# Patient Record
Sex: Female | Born: 1955
Health system: Southern US, Community
[De-identification: ages and names within clinical notes are randomized; demographics above are authoritative.]

## PROBLEM LIST (undated history)

## (undated) DIAGNOSIS — Z8739 Personal history of other diseases of the musculoskeletal system and connective tissue: Secondary | ICD-10-CM

## (undated) HISTORY — DX: Personal history of other diseases of the musculoskeletal system and connective tissue: Z87.39

## (undated) HISTORY — PX: OTHER SURGICAL HISTORY: SHX169

---

## 2016-02-05 HISTORY — PX: BLADDER TUMOR EXCISION: SHX238

## 2017-02-17 DIAGNOSIS — R87613 High grade squamous intraepithelial lesion on cytologic smear of cervix (HGSIL): Secondary | ICD-10-CM

## 2017-11-17 ENCOUNTER — Ambulatory Visit: Admit: 2017-11-17 | Discharge: 2017-11-17 | Payer: BLUE CROSS/BLUE SHIELD

## 2017-11-17 DIAGNOSIS — Z0000 Encounter for general adult medical examination without abnormal findings: Secondary | ICD-10-CM

## 2017-11-17 DIAGNOSIS — Z5189 Encounter for other specified aftercare: Secondary | ICD-10-CM

## 2017-11-17 DIAGNOSIS — F419 Anxiety disorder, unspecified: Secondary | ICD-10-CM

## 2017-11-17 DIAGNOSIS — L989 Disorder of the skin and subcutaneous tissue, unspecified: Secondary | ICD-10-CM

## 2017-11-17 DIAGNOSIS — M1711 Unilateral primary osteoarthritis, right knee: Secondary | ICD-10-CM

## 2017-11-17 DIAGNOSIS — Z96651 Presence of right artificial knee joint: Secondary | ICD-10-CM

## 2017-11-17 DIAGNOSIS — Z1331 Encounter for screening for depression: Secondary | ICD-10-CM

## 2017-11-17 MED ORDER — LORAZEPAM 1 MG TABLET
1 | ORAL_TABLET | Freq: Every day | ORAL | 0 refills | Status: DC | PRN
Start: 2017-11-17 — End: 2018-05-04

## 2017-11-17 MED ORDER — TRAZODONE 50 MG TABLET
50 | ORAL | Status: DC
Start: 2017-11-17 — End: 2018-05-04

## 2017-11-17 MED ORDER — LORAZEPAM 1 MG TABLET
1 | ORAL | Status: DC
Start: 2017-11-17 — End: 2017-11-17

## 2017-11-17 NOTE — Patient Instructions (Signed)
(  1) Let me know about what you find regarding breast cancer screening modality    (2) Referral to Dermatology    (3) Referral to [facility] for Integrative Medicine -- [email or web address]    (4) Labs    ------    It was very nice to meet you. We have same day appointments for urgent illnesses. Please call during the morning if possible, and we will try to offer you an appointment that day or the next day.     We also have Telehealth (phone/video) visits for problems that don't need a physical exam. Please ask our staff about this if you are interested.    For test results, I will usually notify you within 1-5 days of their completion.     My clinic sessions are Tuesdays, Thursdays, and Fridays all day, Monday and Wednesday mornings. When I am not in the office, urgent matters will be handled by our nurses and my colleagues.     Please send a brief message via MyChart or call the office if you have questions or concerns. Please allow 1-4 days for a response. Thank you!   Celine Mans, MD

## 2017-11-17 NOTE — Progress Notes (Signed)
[facility]    Gina Spence is a 62 y.o. female here for:    Chief Complaint   Patient presents with    Establish Care       HPI  New patient to me. 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   Declined LEEP  Per [facility] --   "Trying naturopathic treatments - topical and systemic Garlic and CBD oil  No obvious findings on colpo or cx bx 04/14/17 but HSIL on ECC   Repeat Pap and ECC 07/07/17: ASCUS with HPV 16 and ECC - negative  Advise repeat pap/ECC in 6 months"  She has follow-up coming up in December 2019  "I eradicated it myself with essential oils, mushrooms, cannabis, garlic oils"    She is a psychotherapist -- specializing in trauma, including cancer patients    #Interest in naturopathy, Functional Medicine  #Seen at [facility] 05/2015  She works with a Loss adjuster, chartered in [place], Dr Wilhelmenia Blase  She does a lot of reading on her own  Previous had work-up regarding adrenal and thyroid issues -- not taking any supplement for this. Repeat work-up was borderline low, so changed her diet and improved    #Depression  #Anxiety  Previously on medication, Duloxetine  When she feels depression/anxiety coming on, she does "intestinal repair" x couple of weeks  Takes Ativan 1mg  PRN, as her "silver bullet" -- about 1x per week    #Insomnia since childhood  Trazodone PRN, about 1x per week  Supplements:  GABA daily  Cycles thgouth Magnesium, L tryptophane, melatonin 3mg  (plant based)    #Leaky gut  Takes digestive enzymes    #Hx of bladder cancer s/p TURBT 01/2017  [facility] --   "Gross hematuria workup including CT scan and cystoscopy showed a large left wall bladder tumor and a smaller right anterior low-grade appearing tumor. The CT scan did not reveal it was  emanating from the left ureteral orifice. However, the left ureteral orifice was obscured cystoscopically. The patient is in excellent health. She was on cod liver which has  some omega-3 fatty acids with a minor risk of  increased bleeding. It is not risky enough to cancel the procedure."  She has upcoming follow-up scheduled with Urologist    #Plans for R knee replacement in March 2020  #Hx of L TKA in November 2016 at [facility], Dr Purcell Mouton  Osteoarthritis    Exercise: Used to be avid walker, but difficult with osteoarthritis.   Does yoga.   Used to be horseback rider, skier.      Diet: "Strictly organic." "I have not eaten processed food x 15 years"  Vegetarian   Makes bone broth with shitake mushrooms    #Colonoscopy , 4-5 years ago in [place] at age 89  Told normal and repeat in 10 years  Does colon cleanse every year    Takes Calcium and Vitamin D    CURES        GU History:  LMP: No LMP recorded. Patient is postmenopausal.   Last Pap: As above  Last Mammogram: 5 years ago in [place], normal    Past Medical History:   Diagnosis Date    Bladder cancer ([place])     Depression with anxiety     Osteoarthritis        Past Surgical History:   Procedure Laterality Date    REPLACEMENT TOTAL KNEE Left  12/2014    Dr Purcell Mouton at [facility]  01/2017    Bladder cancer       Family History   Problem Relation Name Age of Onset    Lung cancer Mother      Depression Mother      Alcohol abuse Mother      Liver cancer Father      Alcohol abuse Father         Social History     Tobacco Use    Smoking status: Never Smoker    Smokeless tobacco: Never Used   Substance and Sexual Activity    Alcohol use: Yes     Alcohol/week: 1.0 - 2.0 standard drinks     Types: 1 - 2 Glasses of wine per week     Comment: A glass of wine every night     Drug use: Yes     Types: Marijuana     Comment: Once a day at bedtime.    Social History Narrative    From [place]. Moved to [facility] in 1992.    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.     Shares house with 3 tenants; she is on the top floor.     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 No new lumps or bumps  Cardiovascular: No chest pain, palpitations  Respiratory: No shortness of breath, cough  Gastrointestinal: No abd pain or digestive problems  MSK: No joint pain  Skin: No rashes  GU: No problems urinating  Neurological: No focal weakness or numbness    All other ROS reviewed and negative unless noted in HPI or above    PHYSICAL EXAM  Body mass index is 23.25 kg/m.  BP 117/79 (BP Location: Left upper arm, Patient Position: Sitting, Cuff Size: Adult)   Pulse 76   Temp 36.6 C (97.8 F) (Oral)   Ht 183.6 cm (6' 0.28")   Wt 78.4 kg (172 lb 12.8 oz)   SpO2 98%   Breastfeeding? No   BMI 23.25 kg/m     General: Alert. Appears stated age. Cooperative and conversant. No acute distress.  Head: Normocephalic, without obvious abnormality. Atraumatic.  Eyes: Anicteric sclera. Lids & lashes normal, normal conjunctiva.    Nose: Nares normal, no drainage.  Mouth: Lips, mucosa, and tongue normal; teeth and gums normal, pharynx normal  Neck: Supple without lymphadenopathy, masses, tenderness, or thyromegaly  Lungs: Normal respiratory effort, speaking complete sentences.  No wheezes, rales, or rhonchi in bilateral lung fields.  Heart: Regular rate and rhythm.  Normal S1 and S2. No murmurs, rubs, clicks, or gallops.  Abdomen: Soft, nontender, nondistended, no masses, no guarding, no rigidity.  Extremities: No cyanosis. No lower extremity edema.  Psych: Normal affect and mood. Fair insight and judgment  Skin: Warm and dry. No rashes. Normal turgor. 1cm lesion in R upper arm with pearly shine  Neuro: Alert and oriented x3, normal strength in extremities, normal gait.    PHQ-2 Total Score: 2    LABORATORY  I have reviewed all results pertinent to this visit.    ASSESSMENT AND PLAN  Well adult exam  Encouraged aerobic exercise for 20-30 minutes most days and promoted a healthy diet low in saturated/trans fats, whole dairy products, sodium, and refined carbs. Limit red meats in diet. Discussed cardiovascular risk factors, and order screening labs. Reviewed vaccinations, and she declines all vaccinations now and in the future. Discussed age-appropriate cancer screenings and she declines mammogram -- interested in modality without radiation -- reviewed MRI, ultrasound (done with mammogram).  Reviewed skin cancer prevention with proper sunscreen use.   Taking multiple supplements, so will check BMP and liver enzymes  -     Hemoglobin A1c; Future  -     Cholesterol, LDL (incl.Tot. and HDL chol.,trig.); Future  -     Thyroid Stimulating Hormone; Future  -     Glucose, fasting; Future  -     Basic Metabolic Panel - [place]/[facility] (NA, K, CL, CO2, BUN, CR, GLU, CA); Future  -     Alanine Transaminase; Future  -     Aspartate Transaminase; Future  -     Complete Blood Count with Differential; Future  -     HIV Ag/Ab Combo; Future  -     (HCVSP) Hepatitis C Antibody with Reflex RNA; Future    Anxiety  CURES database was reviewed and consistent with expected prescribing.  Reviewed use of controlled substance, risks including CNS depression, dependence, somnolence. Do not drive while takign medication; do not mix with EtOH  She takes Ativan 1mg  PRN about 1x per weeek  -     Refill LORazepam (ATIVAN) 1 mg tablet; Take 1 tablet (1 mg total) by mouth daily as needed for Anxiety. #30, NI7  -     Ambulatory Referral to Integrative Medicine; Future    Complementary medicine  She has a longstanding interest in homeopathy, working with Functional Medicine, naturopath  Taking multiple supplements  Interested in [place] consult  -     Ambulatory Referral to Integrative Medicine; Future    Osteoarthritis of right knee, unspecified osteoarthritis type  S/p L TKA  She will follow-up with Ammie Ferrier re R TKA  She will let me know if  she'd like [facility] referral    Skin lesion  RUE pearly lesion 1cm that is itchy, changing  -     Ambulatory Referral to Dermatology; Future    Preventative Health  - Encouraged exercise for 30 minutes most days and promoted a healthy diet  - Tobacco screen - non smoker  Immunizations: declines all vaccines  Labs:   - HIV screen: ordered  - Hep C screen: ordered  - Colon CA screen: reports colonoscopy at age 58 was normal in [place]  Women's health:  - Encouraged adequate daily doses of calcium and Vitamin D - yes  - PAP: up to date; will follow-up with Gyn in Dec 2019 (hx of HGSIL)  - Mammogram: she declines mammogram, will let me know if changes mind  - DEXA: discussed    Health Maintenance   Topic Date Due    HEPATITIS C ROUTINE SCEENING  04/05/1971    HIV ROUTINE SCREENING  04/05/1971    BREAST CANCER SCREENING [place]  04/05/2003    COLORECTAL SCREEN FOBT ANNUAL [place]  04/05/2003    SELECT A CRC SCREENING PROGRAM  04/05/2003    Depression Screening (Annual)  11/18/2018    Tobacco Screening  11/18/2019    CERVICAL CANCER SCREENING [place]  07/07/2020    Tdap Immunization  Discontinued    Zoster Vaccines   Discontinued    INFLUENZA  VACCINES [place]  Discontinued    Td Immunization  Discontinued       Return to clinic PRN .    Celine Mans, MD  11/17/2017

## 2017-11-24 NOTE — Telephone Encounter (Signed)
Updated patient's pharmacy.

## 2018-03-17 NOTE — Telephone Encounter (Signed)
-----   Message from Pearletha Alfred sent at 03/17/2018  8:15 AM PST -----  Regarding: FW: Transfer Request From [facility] to [facility]  Please assist with the request at earliest convenience.   ----- Message -----  From: Rachelle Hora  Sent: 03/16/2018  10:53 AM PST  To: [facility] Management, [facility] Management  Subject: Transfer Request From [facility] to [facility]         Transfer Request From [facility] to [facility]    Reason (if any): Location is too far from home, requested Dr. Al Pimple  Requested time frame for appt (if any):N/A  Patient can be reached at ph: [phone]    Thank you,     Tyra

## 2018-03-17 NOTE — Telephone Encounter (Signed)
Spoke to pt briefly.  She was out doing errands and stated she will c/b to schedule.    Please schedule transfer appt w/ Dr. Al Pimple.

## 2018-04-14 NOTE — Telephone Encounter (Signed)
Patient is due for a Risk analyst. It is pending. Please sign so I can mail it to the patient. Thank you!

## 2018-04-14 NOTE — Telephone Encounter (Signed)
Mailed the fit kit to the patient. Also notified the patient

## 2018-04-14 NOTE — Telephone Encounter (Signed)
Screening for colon cancer  -     Fecal Occult Blood Test (FIT); Future

## 2018-05-04 DIAGNOSIS — C679 Malignant neoplasm of bladder, unspecified: Secondary | ICD-10-CM

## 2018-05-04 DIAGNOSIS — Z8551 Personal history of malignant neoplasm of bladder: Secondary | ICD-10-CM

## 2018-05-04 DIAGNOSIS — G4700 Insomnia, unspecified: Secondary | ICD-10-CM

## 2018-05-04 DIAGNOSIS — R87613 High grade squamous intraepithelial lesion on cytologic smear of cervix (HGSIL): Secondary | ICD-10-CM

## 2018-05-04 DIAGNOSIS — R8290 Unspecified abnormal findings in urine: Secondary | ICD-10-CM

## 2018-05-04 DIAGNOSIS — F419 Anxiety disorder, unspecified: Secondary | ICD-10-CM

## 2018-05-04 MED ORDER — TRAZODONE 50 MG TABLET
50 | ORAL_TABLET | ORAL | 0 refills | Status: DC
Start: 2018-05-04 — End: 2018-08-18

## 2018-05-04 MED ORDER — LORAZEPAM 1 MG TABLET
1 | ORAL_TABLET | Freq: Every day | ORAL | 0 refills | Status: DC | PRN
Start: 2018-05-04 — End: 2018-07-08

## 2018-05-04 NOTE — Assessment & Plan Note (Signed)
Diagnosed in 2017 after presenting  with gross hematuria.s/p TURBT. Follows every 6 months with  urologist

## 2018-05-04 NOTE — Progress Notes (Signed)
Gina Spence is a 63 y.o. female new patient here with a chief complaint of Establish Care      HPI  Gina Spence Is here to establish care. She has history of chronic anxiety. She sees  a therapist weekly. 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 Covid situation has been affecting her a lot specially given its been affecting her patients as well.  Uses Xanax for panic attacks maximum once or twice a week. Has been using this regimen to manage her panic attacks for more than 20 years now.    Since the past month has noticed that the smell of her urine has changed. No fevers, abdominal pain, hematuria, low back pain or flank pain. No burning with urination. No urgency or discomfort with urination. She has history of bladder cancer and follows with a urologist twice a year.      Past Medical History:   Diagnosis Date    Bladder cancer ([place])     Depression with anxiety     Osteoarthritis        Past Surgical History:   Procedure Laterality Date    REPLACEMENT TOTAL KNEE Left 12/2014    Dr Purcell Mouton at [facility]  01/2017    Bladder cancer       Family History   Problem Relation Name Age of Onset    Lung cancer Mother      Depression Mother      Alcohol abuse Mother      Liver cancer Father      Alcohol abuse Father         Social History     Socioeconomic History    Marital status: Divorced     Spouse name: Not on file    Number of children: Not on file    Years of education: Not on file    Highest education level: Not on file   Occupational History    Not on file   Social Needs    Financial resource strain: Not on file    Food insecurity:     Worry: Not on file     Inability: Not on file    Transportation needs:     Medical: Not on file     Non-medical: Not on file   Tobacco Use    Smoking status: Never Smoker    Smokeless tobacco: Never Used   Substance and Sexual Activity    Alcohol use: Yes     Alcohol/week: 1.0 - 2.0 standard  drinks     Types: 1 - 2 Glasses of wine per week     Comment: A glass of wine every night     Drug use: Yes     Types: Marijuana     Comment: Once a day at bedtime.     Sexual activity: Not on file   Lifestyle    Physical activity:     Days per week: Not on file     Minutes per session: Not on file    Stress: Not on file   Relationships    Social connections:     Talks on phone: Not on file     Gets together: Not on file     Attends religious service: Not on file     Active member of club or organization: Not on file     Attends meetings of clubs or organizations: Not on file     Relationship  status: Not on file    Intimate partner violence:     Fear of current or ex partner: Not on file     Emotionally abused: Not on file     Physically abused: Not on file     Forced sexual activity: Not on file   Other Topics Concern    Not on file   Social History Narrative    From [place]. Moved to [facility] in [place].    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     Shares house with 3 tenants; she is on the top floor.     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  Appears stated age.  Cooperative and conversant.  No acute distress.  Head: Normocephalic, without obvious abnormality.  Atraumatic.  Mouth: No mouth deviation   Psych: normal affect & mood  Skin: On inspection no rash  Neuro: Alert          ASSESSMENT AND PLAN      Issues we discussed today and recommendations are  Insomnia, unspecified type  -     traZODone (DESYREL) 50 mg tablet; trazodone 50 mg tablet  Dispense: 90 tablet; Refill: 0    Anxiety  Reinforced side effects of Xanax including constipation, risk of abuse and dependence and risk of cognitive impairment among others  Reviewed  cures  Patient will continue to use no more than once or twice a week for panic attacks  Does not want to start any other medication for anxiety at this time  Tries to take as few medications as possible in general  Continues to follow up with her therapist    -     LORazepam (ATIVAN) 1 mg tablet; Take 1 tablet (1 mg total) by mouth daily as needed for Anxiety.  Dispense: 30 tablet; Refill: 0  -     traZODone (DESYREL) 50 mg tablet; trazodone 50 mg tablet  Dispense: 90 tablet; Refill: 0     Altered smelling urine  many foods like asparagus and many herbal supplements and medications can change the smell of  Urine  please eliminate any herbal supplements one by one and see if this helps with resolution of the smell  it's unlikely you have an infection given one-month duration of the change in smell, no pain or burning when urinating, no fevers or low back pain. Please let me know right away if the above symptoms develop.  If this continues to persist please let me know and I'll order a urinalysis with microscopy which is done at the lab      Bladder cancer [facility])  Diagnosed in 2017 after presenting  with gross hematuria.s/p TURBT. Follows every 6 months with  urologist    HGSIL (high grade squamous intraepithelial lesion) on Pap smear of cervix  No obvious findings on colpo or cx bx 04/14/17 but HSIL on ECC  Repeat pap 07/07/17 - ASCUS/HPV 16 pos/ECC neg  Repeat pap/HPV in 6 months with gynecologist    This note was created using Dragon voice recognition software. Some errors in proofreading can occur. Please contact me if there are errors or changes need to be  modified or if there are any questions.

## 2018-05-04 NOTE — Assessment & Plan Note (Signed)
No obvious findings on colpo or cx bx 04/14/17 but HSIL on ECC  Repeat pap 07/07/17 - ASCUS/HPV 16 pos/ECC neg  Repeat pap/HPV in 6 months with gynecologist

## 2018-05-04 NOTE — Patient Instructions (Signed)
It was very nice seeing you in clinic today.  Issues we discussed today and recommendations are  Insomnia, unspecified type  -     traZODone (DESYREL) 50 mg tablet; trazodone 50 mg tablet  Dispense: 90 tablet; Refill: 0    Anxiety  -     LORazepam (ATIVAN) 1 mg tablet; Take 1 tablet (1 mg total) by mouth daily as needed for Anxiety.  Dispense: 30 tablet; Refill: 0  -     traZODone (DESYREL) 50 mg tablet; trazodone 50 mg tablet  Dispense: 90 tablet; Refill: 0     Altered smelling urine  many foods like asparagus and many herbal supplements and medications can change the smell of  Urine  please eliminate any herbal supplements one by one and see if this helps with resolution of the smell  it's unlikely you have an infection given one-month duration of the change in smell, no pain or burning when urinating, no fevers or low back pain. Please let me know right away if the above symptoms develop.  If this continues to persist please let me know and I'll order a urinalysis with microscopy which is done at the lab    This note was created using Dragon voice recognition software. Some errors in proofreading can occur. Please contact me if there are errors or changes need to be modified or if there are any questions.       You can always email me on my chart or call my office if you have any questions after your visit

## 2018-05-05 ENCOUNTER — Ambulatory Visit: Admit: 2018-05-05 | Discharge: 2018-05-05 | Payer: MEDICARE

## 2018-05-05 NOTE — Telephone Encounter (Signed)
Please indicate how many tablets per day for trazadone

## 2018-05-07 NOTE — Progress Notes (Signed)
Called in prescription

## 2018-05-07 NOTE — Telephone Encounter (Signed)
Pharmacy called to get directions for trazodone.    Also, pharmacy knows Dr. Al Pimple tried to contact them regarding lorazepam Rx and wanted to follow up.    Please advise thank you.      [facility] [place], [place] [phone] (Phone)  [phone] (Fax)

## 2018-05-07 NOTE — Telephone Encounter (Signed)
Copied from [place] [id number]. Topic: [place]  >> May 07, 2018  1:56 PM Mayra Cerrato wrote:  Pt called and said Pharmacy does not have the LORazepam (ATIVAN) 1 mg tablet.  Pt also mention that traZODone  does not have any instruction and pharmacy is requesting them. Please re-send these medications with the instructions so pt can get them. Pt said she is at the pharmacy at the moment at would like to get them today so she does not have to be going in and out of her house.    [facility] [place], [place] [phone] (Phone)  [phone] (Fax)

## 2018-07-08 NOTE — Telephone Encounter (Signed)
Copied from [place] #[id number]. Topic: [place]  >> Jul 08, 2018  3:42 PM Uvaldo Rising wrote:  Pt requesting refill for LORazepam (ATIVAN) 1 mg tablet to be sent to [place], [place].    Thank you

## 2018-07-10 MED ORDER — LORAZEPAM 1 MG TABLET
1 | ORAL_TABLET | Freq: Every day | ORAL | 0 refills | Status: DC | PRN
Start: 2018-07-10 — End: 2018-08-18

## 2018-08-19 ENCOUNTER — Ambulatory Visit: Admit: 2018-08-19 | Discharge: 2018-08-19 | Payer: MEDICARE

## 2018-08-19 DIAGNOSIS — Z01818 Encounter for other preprocedural examination: Secondary | ICD-10-CM

## 2018-08-19 MED ORDER — LORAZEPAM 1 MG TABLET
1 | ORAL_TABLET | Freq: Every day | ORAL | 0 refills | 10.00000 days | Status: AC | PRN
Start: 2018-08-19 — End: 2020-04-20

## 2018-08-19 MED ORDER — TRAZODONE 50 MG TABLET
50 | ORAL_TABLET | ORAL | 0 refills | Status: DC
Start: 2018-08-19 — End: 2018-08-21

## 2018-08-19 NOTE — Progress Notes (Signed)
I performed this consultation using real-time Telehealth tools, including a live video connection between my location and the patient's location. Prior to initiating the consultation, I obtained informed verbal consent to perform this consultation using Telehealth tools and answered all the questions about the Telehealth interaction.     Gina Spence is a 63 y.o. female here for requesting COVID test .    Overall Assessment   Preop COVID testing       Diagnosis and Plan   Pre-op testing  -     Ambulatory Referral to [place] Appointment or Respiratory Screening Clinic ([place] ONLY); Future  -     COVID-19 RNA, Qualitative ASYMPTOMATIC and NO SPECIFIC COVID-19 SUSPICION; Outpt w/ planned admission, surgery or aerosol-gen procedure in 3-4 days (preferred); Future          History     History of Present Illness:  Planned dental procedure in 5 days for gum abscess.  No COVIDsx  Review of Systems:   Pertinent review of symptoms noted in the history of the present illness.    Allergies/Contraindications  No Known Allergies  Current Medications       Dosage    LORazepam (ATIVAN) 1 mg tablet Take 1 tablet (1 mg total) by mouth daily as needed for Anxiety    traZODone (DESYREL) 50 mg tablet trazodone 50 mg tablet        Patient's past medical, surgical, family and social histories were reviewed and updated as appropriate.    Physical Exam   Constitutional:       General: Not in acute distress.     Appearance: Normal appearance.     HENT:      Head: Normocephalic.      Ears: normal shape.     Nose: No nasal deformity.   Eyes:      General: No scleral icterus.     Conjunctiva/sclera: Conjunctivae normal.   Neck:      Musculoskeletal: Moving neck easily.  Pulmonary:      Effort: Respiratory effort is normal. No respiratory distress.   Musculoskeletal:         General: No swelling or deformity present   Skin:     Coloration: Skin without pallor.  Neurological:      Mental Status: Alert.   Psychiatric:         Mood and  Affect: Mood and affect normal.         Behavior: Behavior normal.         Thought Content: Thought content normal.         Judgment: Judgment normal.       Ginny Forth, MD

## 2018-08-20 ENCOUNTER — Ambulatory Visit: Admit: 2018-08-20 | Discharge: 2018-08-20 | Payer: MEDICARE

## 2018-08-20 DIAGNOSIS — Z01818 Encounter for other preprocedural examination: Secondary | ICD-10-CM

## 2018-08-21 LAB — COVID-19 RNA, RT-PCR/NUCLEIC A: COVID-19 RNA, RT-PCR/Nucleic A: NOT DETECTED

## 2018-08-21 MED ORDER — TRAZODONE 50 MG TABLET
50 mg | ORAL_TABLET | Freq: Every day | ORAL | 0 refills | Status: AC
Start: 2018-08-21 — End: 2020-03-23

## 2018-08-21 NOTE — Telephone Encounter (Signed)
Faxed covid results.

## 2018-08-21 NOTE — Telephone Encounter (Signed)
Copied from [place] [id number]. Topic: [place]  >> Aug 20, 2018  3:32 PM Esaw Grandchild wrote:  Patient had covid swab today and was told results will be on MyChart tomorrow morning, 7/17.  Please fax copy of results to patient's dentist:    Dr. Donnetta Simpers    Fax [phone]  [phone] or [phone]

## 2018-08-21 NOTE — Telephone Encounter (Signed)
[facility] called to clarify directions for trazedone, none were written.  Please call or send order to pharmacy thank you.    [facility] [place], [place] [phone] (Phone)  [phone] (Fax)

## 2018-08-21 NOTE — Addendum Note (Signed)
Addended by: Carlisle Beers on: 08/21/2018 03:49 PM     Modules accepted: Orders

## 2018-08-21 NOTE — Telephone Encounter (Signed)
Per pharmacy, what is the directions, how many pills, frequency of use for trazadone

## 2018-08-28 NOTE — Telephone Encounter (Signed)
Covid test results faxed to pt's dentist Dr. Donnetta Simpers.

## 2018-09-02 ENCOUNTER — Encounter: Admit: 2018-09-02 | Discharge: 2018-09-03 | Payer: MEDICARE

## 2018-09-02 DIAGNOSIS — Z1211 Encounter for screening for malignant neoplasm of colon: Secondary | ICD-10-CM

## 2018-09-04 LAB — FECAL OCCULT BLOOD TEST: Fecal Occult Blood Test: NEGATIVE

## 2018-12-11 ENCOUNTER — Other Ambulatory Visit: Payer: Self-pay

## 2018-12-14 ENCOUNTER — Encounter: Payer: Self-pay | Admitting: Family Medicine

## 2018-12-14 ENCOUNTER — Ambulatory Visit (INDEPENDENT_AMBULATORY_CARE_PROVIDER_SITE_OTHER): Payer: Medicare Other | Admitting: Family Medicine

## 2018-12-14 ENCOUNTER — Other Ambulatory Visit: Payer: Self-pay

## 2018-12-14 VITALS — BP 122/80 | HR 77 | Temp 97.5°F | Ht 73.0 in | Wt 173.2 lb

## 2018-12-14 DIAGNOSIS — Z5320 Procedure and treatment not carried out because of patient's decision for unspecified reasons: Secondary | ICD-10-CM | POA: Diagnosis not present

## 2018-12-14 DIAGNOSIS — F411 Generalized anxiety disorder: Secondary | ICD-10-CM

## 2018-12-14 DIAGNOSIS — Z2821 Immunization not carried out because of patient refusal: Secondary | ICD-10-CM | POA: Diagnosis not present

## 2018-12-14 DIAGNOSIS — G4700 Insomnia, unspecified: Secondary | ICD-10-CM | POA: Diagnosis not present

## 2018-12-14 DIAGNOSIS — Z0000 Encounter for general adult medical examination without abnormal findings: Secondary | ICD-10-CM

## 2018-12-14 MED ORDER — LORAZEPAM 1 MG PO TABS: 1.0000 mg | ORAL_TABLET | Freq: Every day | ORAL | 0 refills | Status: DC | PRN

## 2018-12-14 MED ORDER — BELSOMRA 20 MG PO TABS: 20.0000 mg | ORAL_TABLET | Freq: Every evening | ORAL | 5 refills | Status: DC | PRN

## 2018-12-14 MED ORDER — LORAZEPAM 1 MG PO TABS: 1.0000 mg | ORAL_TABLET | Freq: Three times a day (TID) | ORAL | 0 refills | Status: DC | PRN

## 2018-12-14 MED FILL — LORAZEPAM 1 MG TABS: 1 | 30 days supply | Qty: 30 | Fill #0

## 2018-12-14 NOTE — Progress Notes (Signed)
Chief Complaint  Patient presents with  . New Patient (Initial Visit)    would like a physical    Subjective: Pt here for initial Welcome to Medicare Evaluation.  Hx of anxiety. Takes Ativan 1 g around 3x/week. Not on daily med. Was on trazodone for insomnia, does not work any longer. Reports she had her brain chemistry examined. Not interested in Ambien, wondering if anything new has come out.   Past Medical History:  Diagnosis Date  . History of knee problem    Family History  Problem Relation Age of Onset  . Cancer Mother        lung cancer  . Alcohol abuse Mother   . Alcohol abuse Father    Past Surgical History:  Procedure Laterality Date  . BLADDER TUMOR EXCISION  2018  . left knee replacement       Current Outpatient Medications on File Prior to Visit  Medication Sig Dispense Refill  . LORazepam (ATIVAN) 1 MG tablet Take as needed     No Known Allergies  Females: Smoking, alcohol/drugs, sunscreen  Mental Health/Substance abuse evaluation: No  PHQ-2:  Feelings of depression? No  Loss of satisfaction/pleasure in doing things? No   Fall Risk: Less than 2 falls within the past 12 months? Yes  Mindful of grabbing bars in bathroom, ruffles in rugs, poorly lit areas, handrails on the stairs? Yes   Discussion of functional ability done: Encouraged to maintain physical activity and flexibility. Live alone? No  Need help with the following? Bathing: No  Managing money: No  Taking medications: No  Telephone use: No  Transportation: No  Shopping: No  Preparing meals: No   Hearing/Vision screen: Trouble hearing TV or radio when others do not? No  Straining or struggling to hear/understand conversation? No  .scrhrvis  Fire safety: Have a working smoke alarm? Yes   Diet Balanced diet? Yes  Assistance needed? No .  ROS:  Endo: No wt gain Msk: No pain CV: No CP Lungs: No sob Psych: +anxiety, insomnia Neuro: NO balance issues Eyes: +steady vision  changes Const: no fevers GU: no urinary complaints Ears: +hearing loss 2/2 noise exposure  BP 122/80 (BP Location: Left Arm, Patient Position: Sitting, Cuff Size: Normal)   Pulse 77   Temp (!) 97.5 F (36.4 C) (Temporal)   Ht 6\' 1"  (1.854 m)   Wt 173 lb 4 oz (78.6 kg)   SpO2 96%   BMI 22.86 kg/m  Gen: awake, alert, appears around stated age Heart: RRR, no bruits, no LE edema GI: S, BS+, NT, ND, no masses or organomegaly Lungs: CTAB, no access muscle use Eyes: Perrla, EOMi, sclera white Skin: Warm and dry Neuro: Gait nml, no cerebellar signs Psych: Age appropriate judgment and insight   End of life care planning/counseling: Does patient wish to discuss end of life care/planning? Yes  Does the patient have an advanced directive? Yes  Patient code status/living will: DNR Forms given? Yes   Assessment and Plan Welcome to Medicare preventive visit  Insomnia, unspecified type - Plan: Suvorexant (BELSOMRA) 20 MG TABS  Refused influenza vaccine  Refused tetanus toxoid  Refused pneumococcal vaccination  Mammogram declined  GAD (generalized anxiety disorder) - Plan: LORazepam (ATIVAN) 1 MG tablet   Colonoscopy/Mammogram/Pelvic exam- Written plan in AVS for preventative health services. Declines them all at this time. Await records.  Dr of urology team.  Immunizations declined as well.  F/u in 6 weeks to reck sleep aid. The patient voiced understanding and  agreement to the plan.  [place], DO 12/14/18 11:06 AM

## 2018-12-14 NOTE — Patient Instructions (Addendum)
Keep the diet clean and stay active.  Let us know if you need anything. 

## 2018-12-14 NOTE — Addendum Note (Signed)
Addended by: Sharon Seller B on: 12/14/2018 11:13 AM   Modules accepted: Orders

## 2018-12-14 NOTE — Addendum Note (Signed)
Addended by: Sharon Seller B on: 12/14/2018 11:14 AM   Modules accepted: Orders

## 2019-02-08 ENCOUNTER — Ambulatory Visit: Payer: Medicare Other | Admitting: Family Medicine

## 2019-03-09 ENCOUNTER — Encounter: Payer: Self-pay | Admitting: Family Medicine

## 2019-03-09 ENCOUNTER — Other Ambulatory Visit: Payer: Self-pay

## 2019-03-09 ENCOUNTER — Ambulatory Visit (INDEPENDENT_AMBULATORY_CARE_PROVIDER_SITE_OTHER): Payer: Medicare Other | Admitting: Family Medicine

## 2019-03-09 VITALS — BP 120/72 | HR 102 | Temp 96.8°F | Ht 73.0 in | Wt 183.1 lb

## 2019-03-09 DIAGNOSIS — M79632 Pain in left forearm: Secondary | ICD-10-CM

## 2019-03-09 DIAGNOSIS — G4700 Insomnia, unspecified: Secondary | ICD-10-CM | POA: Diagnosis not present

## 2019-03-09 DIAGNOSIS — M25561 Pain in right knee: Secondary | ICD-10-CM

## 2019-03-09 DIAGNOSIS — G8929 Other chronic pain: Secondary | ICD-10-CM

## 2019-03-09 DIAGNOSIS — F411 Generalized anxiety disorder: Secondary | ICD-10-CM | POA: Diagnosis not present

## 2019-03-09 DIAGNOSIS — M79631 Pain in right forearm: Secondary | ICD-10-CM

## 2019-03-09 MED ORDER — LORAZEPAM 1 MG PO TABS: 1.0000 mg | ORAL_TABLET | Freq: Every day | ORAL | 3 refills | Status: DC | PRN

## 2019-03-09 MED FILL — LORAZEPAM 1 MG TABS: 1 | 30 days supply | Qty: 30 | Fill #0

## 2019-03-09 NOTE — Patient Instructions (Addendum)
If you do not hear anything about your referrals in the next 1-2 weeks, call our office and ask for an update.  Heat (pad or rice pillow in microwave) over affected area, 10-15 minutes twice daily.    Wrist and Forearm Exercises Do exercises exactly as told by your health care provider and adjust them as directed. It is normal to feel mild stretching, pulling, tightness, or discomfort as you do these exercises, but you should stop right away if you feel sudden pain or your pain gets worse.   RANGE OF MOTION EXERCISES These exercises warm up your muscles and joints and improve the movement and flexibility of your injured wrist and forearm. These exercises also help to relieve pain, numbness, and tingling. These exercises are done using the muscles in your injured wrist and forearm. Exercise A: Wrist Flexion, Active 1. With your fingers relaxed, bend your wrist forward as far as you can. 2. Hold this position for 30 seconds. Repeat 2 times. Complete this exercise 3 times per week. Exercise B: Wrist Extension, Active 1. With your fingers relaxed, bend your wrist backward as far as you can. 2. Hold this position for 30 seconds. Repeat 2 times. Complete this exercise 3 times per week. Exercise C: Supination, Active  1. Stand or sit with your arms at your sides. 2. Bend your left / right elbow to an "L" shape (90 degrees). 3. Turn your palm upward until you feel a gentle stretch on the inside of your forearm. 4. Hold this position for 30 seconds. 5. Slowly return your palm to the starting position. Repeat 2 times. Complete this exercise 3 times per week. Exercise D: Pronation, Active  1. Stand or sit with your arms at your sides. 2. Bend your left / right elbow to an "L" shape (90 degrees). 3. Turn your palm downward until you feel a gentle stretch on the top of your forearm. 4. Hold this position for 30 seconds. 5. Slowly return your palm to the starting position. Repeat 2 times. Complete  this exercise once a day.  STRETCHING EXERCISES These exercises warm up your muscles and joints and improve the movement and flexibility of your injured wrist and forearm. These exercises also help to relieve pain, numbness, and tingling. These exercises are done using your healthy wrist and forearm to help stretch the muscles in your injured wrist and forearm. Exercise E: Wrist Flexion, Passive  1. Extend your left / right arm in front of you, relax your wrist, and point your fingers downward. 2. Gently push on the back of your hand. Stop when you feel a gentle stretch on the top of your forearm. 3. Hold this position for 30 seconds. Repeat 2 times. Complete this exercise 3 times per week. Exercise F: Wrist Extension, Passive  1. Extend your left / right arm in front of you and turn your palm upward. 2. Gently pull your palm and fingertips back so your fingers point downward. You should feel a gentle stretch on the palm-side of your forearm. 3. Hold this position for 30 seconds. Repeat 2 times. Complete this exercise 3 times per week. Exercise G: Forearm Rotation, Supination, Passive 1. Sit with your left / right elbow bent to an "L" shape (90 degrees) with your forearm resting on a table. 2. Keeping your upper body and shoulder still, use your other hand to rotate your forearm palm-up until you feel a gentle to moderate stretch. 3. Hold this position for 30 seconds. 4. Slowly release the  stretch and return to the starting position. Repeat 2 times. Complete this exercise 3 times per week. Exercise H: Forearm Rotation, Pronation, Passive 1. Sit with your left / right elbow bent to an "L" shape (90 degrees) with your forearm resting on a table. 2. Keeping your upper body and shoulder still, use your other hand to rotate your forearm palm-down until you feel a gentle to moderate stretch. 3. Hold this position for 30 seconds. 4. Slowly release the stretch and return to the starting  position. Repeat 2 times. Complete this exercise 3 times per week.  STRENGTHENING EXERCISES These exercises build strength and endurance in your wrist and forearm. Endurance is the ability to use your muscles for a long time, even after they get tired. Exercise I: Wrist Flexors  1. Sit with your left / right forearm supported on a table and your hand resting palm-up over the edge of the table. Your elbow should be bent to an "L" shape (about 90 degrees) and be below the level of your shoulder. 2. Hold a 3-5 lb weight in your left / right hand. Or, hold a rubber exercise band or tube in both hands, keeping your hands at the same level and hip distance apart. There should be a slight tension in the exercise band or tube. 3. Slowly curl your hand up toward your forearm. 4. Hold this position for 3 seconds. 5. Slowly lower your hand back to the starting position. Repeat 2 times. Complete this exercise 3 times per week. Exercise J: Wrist Extensors  1. Sit with your left / right forearm supported on a table and your hand resting palm-down over the edge of the table. Your elbow should be bent to an "L" shape (about 90 degrees) and be below the level of your shoulder. 2. Hold a 3-5 lb weight in your left / right hand. Or, hold a rubber exercise band or tube in both hands, keeping your hands at the same level and hip distance apart. There should be a slight tension in the exercise band or tube. 3. Slowly curl your hand up toward your forearm. 4. Hold this position for 3 seconds. 5. Slowly lower your hand back to the starting position. Repeat 2 times. Complete this exercise 3 times per week. Exercise K: Forearm Rotation, Supination  1. Sit with your left / right forearm supported on a table and your hand resting palm-down. Your elbow should be at your side, bent to an "L" shape (about 90 degrees), and below the level of your shoulder. Keep your wrist stable and in a neutral position throughout the  exercise. 2. Gently hold a lightweight hammer with your left / right hand. 3. Without moving your elbow or wrist, slowly rotate your palm upward to a thumbs-up position. 4. Hold this position for 3 seconds. 5. Slowly return your forearm to the starting position. Repeat 2 times. Complete this exercise 3 times per week. Exercise L: Forearm Rotation, Pronation  1. Sit with your left / right forearm supported on a table and your hand resting palm-up. Your elbow should be at your side, bent to an "L" shape (about 90 degrees), and below the level of your shoulder. Keep your wrist stable. Do not allow it to move backward or forward during the exercise. 2. Gently hold a lightweight hammer with your left / right hand. 3. Without moving your elbow or wrist, slowly rotate your palm and hand upward to a thumbs-up position. 4. Hold this position for 3 seconds.  5. Slowly return your forearm to the starting position. Repeat 2 times. Complete this exercise 3 times per week. Exercise M: Grip Strengthening  1. Hold one of these items in your left / right hand: play dough, therapy putty, a dense sponge, a stress ball, or a large, rolled sock. 2. Squeeze as hard as you can without increasing pain. 3. Hold this position for 5 seconds. 4. Slowly release your grip. Repeat 2 times. Complete this exercise 3 times per week.  This information is not intended to replace advice given to you by your health care provider. Make sure you discuss any questions you have with your health care provider. Document Released: 12/05/2004 Document Revised: 10/16/2015 Document Reviewed: 10/16/2014 Elsevier Interactive Patient Education  2018 [employer].   Elbow and Forearm Exercises It is normal to feel mild stretching, pulling, tightness, or discomfort as you do these exercises, but you should stop right away if you feel sudden pain or your pain gets worse. RANGE OF MOTION EXERCISES These exercises warm up your muscles and  joints and improve the movement and flexibility of your injured elbow and forearm. These exercises also help to relieve pain, numbness, and tingling.These exercises are done using the muscles in your injured elbow and forearm. Exercise A: Elbow Flexion, Active 1. Hold your left / right arm at your side, and bend your elbow as far as you can using your left / right arm muscles. 2. Hold this position for 30 seconds. 3. Slowly return to the starting position. Repeat 2 times. Complete this exercise 3 times per week. Exercise B: Elbow Extension, Active 1. Hold your left / right arm at your side, and straighten your elbow as much as you can using your left / right arm muscles. 2. Hold this position for 30 seconds. 3. Slowly return to the starting position. Repeat 2 times. Complete this exercise 3 times per week. Exercise C: Forearm Rotation, Supination, Active 1. Stand or sit with your elbows at your sides. 2. Bend your left / right elbow to an "L" shape (90 degrees). 3. Turn your palm upward until you feel a gentle stretch on the inside of your forearm. 4. Hold this position for 30 seconds. 5. Slowly release and return to the starting position. Repeat 2 times. Complete this exercise 3 times per week. Exercise D: Forearm Rotation, Pronation, Active 1. Stand or sit with your elbows at your side. 2. Bend your left / right elbow to an "L" shape (90 degrees). 3. Turn your left / right palm downward until you feel a gentle stretch on the top of your forearm. 4. Hold this position for 30 seconds. 5. Slowly release and return to the starting position. Repeat2 times. Complete this exercise 3 times per week. STRETCHING EXERCISES These exercises warm up your muscles and joints and improve the movement and flexibility of your injured elbow and forearm. These exercises also help to relieve pain, numbness, and tingling.These exercises are done using your healthy elbow and forearm to help stretch the  muscles in your injured elbow and forearm. Exercise E: Elbow Flexion, Active-Assisted  1. Hold your left / right arm at your side, and bend your elbow as much as you can using your left / right arm muscles. 2. Use your other hand to bend your left / right elbow farther. To do this, gently push up on your forearm until you feel a gentle stretch on the back of your elbow. 3. Hold this position for 30 seconds. 4. Slowly return  to the starting position. Repeat 2 times. Complete this exercise 3 times per week. Exercise F: Elbow Extension, Active-Assisted  1. Hold your left / right arm at your side, and straighten your elbow as much as you can using your left / right arm muscles. 2. Use your other hand to straighten the left / right elbow farther. To do this, gently push down on your forearm until you feel a gentle stretch on the inside of your elbow. 3. Hold this position for 30 seconds. 4. Slowly return to the starting position. Repeat 2 times. Complete this exercise 3 times per weeky. Exercise G: Forearm Rotation, Supination, Active-Assisted  1. Sit with your left / right elbow bent in an "L" shape (90 degrees) with your forearm resting on a table. 2. Keeping your upper body and shoulder still, rotate your forearm so your left / right palm faces upward. 3. Use your other hand to help rotate your forearm further until you feel a gentle to moderate stretch. 4. Hold this position for 30 seconds. 5. Slowly release the stretch and return to the starting position. Repeat 2 times. Complete this exercise 3 times per week. Exercise H: Forearm Rotation, Pronation, Active-Assisted  1. Sit with your left / right elbow bent in an "L" shape (90 degrees) with your forearm resting on a table. 2. Keeping your upper body and shoulder still, rotate your forearm so your palm faces the tabletop. 3. Use your other hand to help rotate your forearm further until you feel a gentle to moderate stretch. 4. Hold this  position for 30 seconds. 5. Slowly release the stretch and return to the starting position. Repeat 2 times. Complete this exercise 3 times per week. Exercise I: Elbow Flexion, Supine, Passive 1. Lie on your back. 2. Extend your left / right arm up in the air, bracing it with your other hand. 3. Let your left / right your hand slowly lower toward your shoulder, while your elbow stays pointed toward the ceiling. You should feel a gentle stretch along the back of your upper arm and elbow. 4. If instructed by your health care provider, you may increase the intensity of your stretch by adding a small wrist weight or hand weight. 5. Hold this position for 3 seconds. 6. Slowly return to the starting position. Repeat 2 times. Complete this exercise 3 times per week. Exercise J: Elbow Extension, Supine, Passive  1. Lie on your back. Make sure that you are in a comfortable position that lets you relax your arm muscles. 2. Place a folded towel under your left / right upper arm so your elbow and shoulder are at the same height. Straighten your left / right arm so your elbow does not rest on the bed or towel. 3. Let the weight of your hand stretch your elbow. Keep your arm and chest muscles relaxed. You should feel a stretch on the inside of your elbow. 4. If told by your health care provider, you may increase the intensity of your stretch by adding a small wrist weight or hand weight. 5. Hold this position for 30 seconds. 6. Slowly release the stretch. Repeat 2 times. Complete this exercise 3 times per week. STRENGTHENING EXERCISES These exercises build strength and endurance in your elbow and forearm. Endurance is the ability to use your muscles for a long time, even after they get tired. Exercise K: Elbow Flexion, Isometric  1. Stand or sit up straight. 2. Bend your left / right elbow in an "L"  shape (90 degrees) and turn your palm up so your forearm is at the height of your waist. 3. Place your  other hand on top of your forearm. Gently push down as your left / right arm resists. Push as hard as you can with both arms without causing any pain or movement at your left / right elbow. 4. Hold this position for 3 seconds. 5. Slowly release the tension in both arms. Let your muscles relax completely before repeating. Repeat 2 times. Complete this exercise 3 times per week. Exercise L: Elbow Extensors, Isometric  1. Stand or sit up straight. 2. Place your left / right arm so your palm faces your abdomen and it is at the height of your waist. 3. Place your other hand on the underside of your forearm. Gently push up as your left / right arm resists. Push as hard as you can with both arms, without causing any pain or movement at your left / right elbow. 4. Hold this position for 3 seconds. 5. Slowly release the tension in both arms. Let your muscles relax completely before repeating. Repeat _______2___ times. Complete this exercise 3 times per week. Exercise M: Elbow Flexion With Forearm Palm Up  1. Sit upright on a firm chair without armrests, or stand. 2. Place your left / right arm at your side with your palm facing forward. 3. Holding a 5 lbweight or gripping a rubber exercise band or tubing, bend your elbow to bring your hand toward your shoulder. 4. Hold this position for 3 seconds. 5. Slowly return to the starting position. Repeat 2 times. Complete this exercise 3 times per week. Exercise N: Elbow Extension  1. Sit on a firm chair without armrests, or stand. 2. Keeping your upper arms at your sides, bring both hands up toward your left / right shoulder while you grip a rubber exercise band or tubing. Your left / right hand should be just below the other hand. 3. Straighten your left / right elbow. 4. Hold this position for 3 seconds. 5. Control the resistance of the band or tubing as your hand returns to your side. Repeat 2 times. Complete this exercise 3 times per week. Exercise  O: Forearm Rotation, Supination  1. Sit with your left / right forearm supported on a table. Keep your elbow at waist height. 2. Rest your hand over the edge of the table with your palm facing down. 3. Gently hold a lightweight hammer. 4. Without moving your elbow, slowly rotate your forearm to turn your palm and hand upward to a "thumbs-up" position. 5. Hold this position for 3 seconds. 6. Slowly return to the starting position. Repeat 2 times. Complete this exercise 3 times per week. Exercise P: Forearm Rotation, Pronation  1. Sit with your left / right forearm supported on a table. Keep your elbow below shoulder height. 2. Rest your hand over the edge of the table with your palm facing up. 3. Gently hold a lightweight hammer. 4. Without moving your elbow, slowly rotate your forearm to turn your palm and hand upward to a "thumbs-up" position. 5. Hold this position for 3 seconds. 6. Slowly return to the starting position. Repeat 2 times. Complete this exercise 3 times per week.  Make sure you discuss any questions you have with your health care provider. Document Released: 12/05/2004 Document Revised: 06/01/2015 Document Reviewed: 10/16/2014 Elsevier Interactive Patient Education  [employer].

## 2019-03-09 NOTE — Progress Notes (Signed)
Chief Complaint  Patient presents with  . Follow-up    6 week  . Arm Pain    both arms    Subjective: Patient is a 64 y.o. female here for f/u.  Started on Belsomra 20 mg qhs at last visit. Reports she never got it due to cost. Does not wish to try any psych meds.   B/l arm pain for past mo. No inj, did go on laptop more than usual. Nml ROM. Tried some ess oils at home without relief. Pain over forearms and back of hands. No swelling, redness, bruising, weakness. R is worse than L. No PO meds.   ROS: Psych: As noted in HPI MSK: +arm pain.   Past Medical History:  Diagnosis Date  . History of knee problem     Objective: BP 120/72 (BP Location: Left Arm, Patient Position: Sitting, Cuff Size: Normal)   Pulse (!) 102   Temp (!) 96.8 F (36 C) (Temporal)   Ht 6\' 1"  (1.854 m)   Wt 183 lb 2 oz (83.1 kg)   SpO2 96%   BMI 24.16 kg/m  General: Awake, appears stated age Lungs: No accessory muscle use MSK: +TTP over dorsal forearm compartment and wrist extensors b/l, worse on R; +TTP over lat epicondyles b/l as well Psych: Age appropriate judgment and insight, normal affect and mood  Assessment and Plan: Insomnia, unspecified type - Plan: Ambulatory referral to Pulmonology  Chronic pain of right knee - Plan: Ambulatory referral to Orthopedic Surgery  Pain in both forearms  GAD (generalized anxiety disorder) - Plan: LORazepam (ATIVAN) 1 MG tablet  1- refer to sleep specialists. Declines medications. Perhaps they can offer better insight into her issue. 2- Pt w hx of chronic R knee pain, success w HA injections, but was told she needs replacement. Will refer. 3- stretches/exercises, heat, braces for wrist, PT if no better. F/u in 6 mo.  The patient voiced understanding and agreement to the plan.  [place], DO 03/09/19  12:44 PM

## 2019-03-18 ENCOUNTER — Other Ambulatory Visit: Payer: Self-pay

## 2019-03-18 ENCOUNTER — Ambulatory Visit (INDEPENDENT_AMBULATORY_CARE_PROVIDER_SITE_OTHER): Payer: Medicare Other

## 2019-03-18 ENCOUNTER — Telehealth: Payer: Self-pay

## 2019-03-18 ENCOUNTER — Ambulatory Visit: Payer: Self-pay

## 2019-03-18 ENCOUNTER — Ambulatory Visit (INDEPENDENT_AMBULATORY_CARE_PROVIDER_SITE_OTHER): Payer: Medicare Other | Admitting: Orthopedic Surgery

## 2019-03-18 DIAGNOSIS — M25561 Pain in right knee: Secondary | ICD-10-CM

## 2019-03-18 DIAGNOSIS — M619 Calcification and ossification of muscle, unspecified: Secondary | ICD-10-CM

## 2019-03-18 NOTE — Telephone Encounter (Signed)
Please get patient approved for right knee gel injection

## 2019-03-19 ENCOUNTER — Telehealth: Payer: Self-pay

## 2019-03-19 NOTE — Telephone Encounter (Signed)
Approved for Synvisc one- Right knee Dr. Myrtie Cruise and Bill No copay No OOP- 2nd insurance to cover after medicare No prior auth required

## 2019-03-19 NOTE — Telephone Encounter (Signed)
Submitted for VOB for Synvisc one-Right knee  °

## 2019-03-20 ENCOUNTER — Encounter: Payer: Self-pay | Admitting: Orthopedic Surgery

## 2019-03-20 NOTE — Progress Notes (Signed)
Office Visit Note   Patient: Tamara Estrada           Date of Birth: 06-09-1955           MRN: [id number] Visit Date: 03/18/2019 Requested by: Shelda Pal, [address] [place],  [place] [address] PCP: Shelda Pal, DO  Subjective: Chief Complaint  Patient presents with  . Right Knee - Pain    HPI: Buna is a patient with right knee pain.  She states that she knows she needs a total knee replacement but is not ready because the timing is not good.  She is here living with her daughter.  She moved from [place].  Had left total knee replacement in [place] in 2016 and is doing well with that.  She does report weakness giving way locking popping and waking with the pain at times.  She is going to be here [place] "for a while".  She has a new grandchild here.  She also reports a painless mass in the right hip and buttock region.  She likes to walk about 3 times a week for miles per time.  She has had gel injections in August which helped her right knee significantly.  Denies any history of injury to the right hip region              ROS: All systems reviewed are negative as they relate to the chief complaint within the history of present illness.  Patient denies  fevers or chills.   Assessment & Plan: Visit Diagnoses:  1. Right knee pain, unspecified chronicity   2. Calcification and ossification of muscle     Plan: Impression is moderate right knee arthritis with gel injection pending as we preapproved Synvisc for the right knee.  Left knee is doing reasonably well.  The right buttock nodule is more enigmatic.  This is a walnut size mass which is nontender but is within the deep tissue and is comprised of multiple calcific nodules.  No history of injury in that area.  She has a single nodular calcification in similar region on the left.  Plan is for MRI with contrast of the pelvis to evaluate both these regions.  We will have a better  idea about a exactly the nature of the nodule calcifications on the right after that study.  Follow-Up Instructions: No follow-ups on file.   Orders:  Orders Placed This Encounter  Procedures  . XR KNEE 3 VIEW RIGHT  . XR HIP UNILAT W OR W/O PELVIS 2-3 VIEWS RIGHT  . MR PELVIS W WO CONTRAST   No orders of the defined types were placed in this encounter.     Procedures: No procedures performed   Clinical Data: No additional findings.  Objective: Vital Signs: There were no vitals taken for this visit.  Physical Exam:   Constitutional: Patient appears well-developed HEENT:  Head: Normocephalic Eyes:EOM are normal Neck: Normal range of motion Cardiovascular: Normal rate Pulmonary/chest: Effort normal Neurologic: Patient is alert Skin: Skin is warm Psychiatric: Patient has normal mood and affect    Ortho Exam: Ortho exam demonstrates normal gait alignment.  Patient has no effusion in the right knee.  Collateral and cruciate ligaments are stable.  She has about a 5 degree flexion contracture but can bend to about 115.  Extensor mechanism is intact.  Right buttock area is examined and she does have a walnut sized mass which is in the deep tissue but is  somewhat mobile.  Nontender to palpation.  Size of a walnut.  Has excellent hamstring strength.  No groin pain with internal extra rotation on the right-hand side.  Specialty Comments:  No specialty comments available.  Imaging: No results found.   PMFS History: Patient Active Problem List   Diagnosis Date Noted  . Insomnia 12/14/2018  . Refused influenza vaccine 12/14/2018  . Refused tetanus toxoid 12/14/2018  . Refused pneumococcal vaccination 12/14/2018  . Mammogram declined 12/14/2018   Past Medical History:  Diagnosis Date  . History of knee problem     Family History  Problem Relation Age of Onset  . Cancer Mother        lung cancer  . Alcohol abuse Mother   . Alcohol abuse Father     Past Surgical  History:  Procedure Laterality Date  . BLADDER TUMOR EXCISION  2018  . left knee replacement     Social History   Occupational History  . Not on file  Tobacco Use  . Smoking status: Former Smoker    Types: Cigarettes    Quit date: 1999    Years since quitting: 22.1  . Smokeless tobacco: Never Used  Substance and Sexual Activity  . Alcohol use: Yes    Comment: 1 drink daily  . Drug use: Yes    Types: Marijuana  . Sexual activity: Not on file

## 2019-03-21 ENCOUNTER — Encounter: Payer: Self-pay | Admitting: Family Medicine

## 2019-03-22 NOTE — Telephone Encounter (Signed)
Called and schedule appointment for 03/31/19 @ 1:45

## 2019-03-23 ENCOUNTER — Encounter: Payer: Self-pay | Admitting: *Deleted

## 2019-03-23 ENCOUNTER — Other Ambulatory Visit: Payer: Self-pay | Admitting: *Deleted

## 2019-03-23 ENCOUNTER — Telehealth: Payer: Self-pay | Admitting: Orthopedic Surgery

## 2019-03-23 DIAGNOSIS — M619 Calcification and ossification of muscle, unspecified: Secondary | ICD-10-CM

## 2019-03-23 NOTE — Telephone Encounter (Signed)
IC s/w patient. She said that she did not want to come in for labs this week, and she is ok not having scan done right away. She prefers to have labs drawn at office visit next week with Dr August Saucer.

## 2019-03-23 NOTE — Telephone Encounter (Signed)
Pt called in stating she got a message on mychart telling her to come in for blood work; pt is wondering if the blood work can be done when she comes in for her appt. On 03-31-19?   [phone]

## 2019-03-23 NOTE — Telephone Encounter (Signed)
Ok, I just left her a mychart message letting her know it was fine.

## 2019-03-27 ENCOUNTER — Ambulatory Visit: Payer: Medicare Other | Attending: Internal Medicine

## 2019-03-27 DIAGNOSIS — Z23 Encounter for immunization: Secondary | ICD-10-CM | POA: Insufficient documentation

## 2019-03-27 NOTE — Progress Notes (Signed)
   Covid-19 Vaccination Clinic  Name:  Lacrisha Bielicki    MRN: [id number] DOB: 09-05-1955  03/27/2019  Ms. Castrillon was observed post Covid-19 immunization for 15 minutes without incidence. She was provided with Vaccine Information Sheet and instruction to access the V-Safe system.   Ms. Teresi was instructed to call 911 with any severe reactions post vaccine: Marland Kitchen Difficulty breathing  . Swelling of your face and throat  . A fast heartbeat  . A bad rash all over your body  . Dizziness and weakness    Immunizations Administered    Name Date Dose VIS Date Route   Pfizer COVID-19 Vaccine 03/27/2019 11:59 AM 0.3 mL 01/15/2019 Intramuscular   Manufacturer: [employer], [employer]   Lot: [id number]   NDC: [address]-6

## 2019-03-31 ENCOUNTER — Other Ambulatory Visit: Payer: Self-pay

## 2019-03-31 ENCOUNTER — Ambulatory Visit (INDEPENDENT_AMBULATORY_CARE_PROVIDER_SITE_OTHER): Payer: Medicare Other | Admitting: Orthopedic Surgery

## 2019-03-31 DIAGNOSIS — M25561 Pain in right knee: Secondary | ICD-10-CM

## 2019-03-31 DIAGNOSIS — M1711 Unilateral primary osteoarthritis, right knee: Secondary | ICD-10-CM

## 2019-03-31 LAB — CREATININE WITH EST GFR
Creat: 0.66 mg/dL (ref 0.50–0.99)
GFR, Est African American: 107 mL/min/{1.73_m2} (ref >=60)
GFR, Est Non African American: 93 mL/min/{1.73_m2} (ref >=60)

## 2019-04-01 ENCOUNTER — Encounter: Payer: Self-pay | Admitting: Orthopedic Surgery

## 2019-04-01 NOTE — Progress Notes (Signed)
Looks good ok for [employer]

## 2019-04-03 DIAGNOSIS — M1711 Unilateral primary osteoarthritis, right knee: Secondary | ICD-10-CM | POA: Diagnosis not present

## 2019-04-03 MED ORDER — LIDOCAINE HCL 1 % IJ SOLN
5.0000 mL | INTRAMUSCULAR | Status: AC | PRN
  Administered 2019-04-03: 5 mL

## 2019-04-03 MED ORDER — HYLAN G-F 20 48 MG/6ML IX SOSY
48.0000 mg | PREFILLED_SYRINGE | INTRA_ARTICULAR | Status: AC | PRN
  Administered 2019-04-03: 48 mg via INTRA_ARTICULAR

## 2019-04-03 NOTE — Progress Notes (Signed)
   Procedure Note  Patient: Tamara Estrada             Date of Birth: [birth year]           MRN: [id number]             Visit Date: 03/31/2019  Procedures: Visit Diagnoses:  1. Right knee pain, unspecified chronicity     Large Joint Inj: R knee on 04/03/2019 8:26 AM Indications: pain, joint swelling and diagnostic evaluation Details: 18 G 1.5 in needle, superolateral approach  Arthrogram: No  Medications: 5 mL lidocaine 1 %; 48 mg Hylan 48 MG/6ML Outcome: tolerated well, no immediate complications Procedure, treatment alternatives, risks and benefits explained, specific risks discussed. Consent was given by the patient. Immediately prior to procedure a time out was called to verify the correct patient, procedure, equipment, support staff and site/side marked as required. Patient was prepped and draped in the usual sterile fashion.     Creatinine drawn today.  She is okay for MRI with contrast to evaluate this mass just below her ischial tuberosity.

## 2019-04-06 ENCOUNTER — Encounter: Payer: Self-pay | Admitting: Orthopedic Surgery

## 2019-04-07 ENCOUNTER — Ambulatory Visit: Payer: Medicare Other | Admitting: Orthopedic Surgery

## 2019-04-08 ENCOUNTER — Ambulatory Visit: Payer: Medicare Other | Admitting: Orthopedic Surgery

## 2019-04-08 ENCOUNTER — Other Ambulatory Visit: Payer: Self-pay

## 2019-04-12 ENCOUNTER — Institutional Professional Consult (permissible substitution): Payer: Medicare Other | Admitting: Pulmonary Disease

## 2019-04-15 ENCOUNTER — Ambulatory Visit: Payer: Medicare Other | Admitting: Orthopedic Surgery

## 2019-04-17 ENCOUNTER — Other Ambulatory Visit: Payer: Self-pay

## 2019-04-17 ENCOUNTER — Ambulatory Visit (HOSPITAL_BASED_OUTPATIENT_CLINIC_OR_DEPARTMENT_OTHER)
Admission: RE | Admit: 2019-04-17 | Discharge: 2019-04-17 | Disposition: A | Discharge status: Home / Self Care | Payer: Medicare Other | Source: Ambulatory Visit | Attending: Orthopedic Surgery | Admitting: Orthopedic Surgery

## 2019-04-17 DIAGNOSIS — M619 Calcification and ossification of muscle, unspecified: Secondary | ICD-10-CM | POA: Insufficient documentation

## 2019-04-17 MED ORDER — GADOBUTROL 1 MMOL/ML IV SOLN
8.5000 mL | Freq: Once | INTRAVENOUS | Status: AC | PRN
  Administered 2019-04-17: 8.3 mL via INTRAVENOUS

## 2019-04-19 ENCOUNTER — Encounter: Payer: Self-pay | Admitting: Orthopedic Surgery

## 2019-04-19 ENCOUNTER — Other Ambulatory Visit: Payer: Self-pay

## 2019-04-19 ENCOUNTER — Ambulatory Visit (INDEPENDENT_AMBULATORY_CARE_PROVIDER_SITE_OTHER): Payer: Medicare Other | Admitting: Orthopedic Surgery

## 2019-04-19 DIAGNOSIS — M619 Calcification and ossification of muscle, unspecified: Secondary | ICD-10-CM | POA: Diagnosis not present

## 2019-04-19 NOTE — Progress Notes (Signed)
   Office Visit Note   Patient: Tamara Estrada           Date of Birth: 1956-02-01           MRN: [id number] Visit Date: 04/19/2019 Requested by: Sharlene Dory, DO [address] [place],  [place] [address] PCP: Sharlene Dory, DO  Subjective: Chief Complaint  Patient presents with  . Follow-up    HPI: Tamara Estrada presents for follow-up of MRI scan.  However she did not get the MRI scan down through the level of calcification.  I did talk to the radiologist.  Looks like it could be fat necrosis.  She is scheduled for more cuts and images on Saturday at no charge.  IV contrast not required per Dr. Allena Katz the radiologist.             ROS: All systems reviewed are negative as they relate to the chief complaint within the history of present illness.  Patient denies  fevers or chills.   Assessment & Plan: Visit Diagnoses: No diagnosis found.  Plan: Impression is right calcified painful mass below the ischial tuberosity.  Plan is further MRI scanning with characterization of the lesion with possible excision if benign and referral if anything about it appears malignant.  She does have similar early calcified nodules on the left-hand side.  We will see if the scan shows and I will call her with those results.  Follow-Up Instructions: Return if symptoms worsen or fail to improve.   Orders:  No orders of the defined types were placed in this encounter.  No orders of the defined types were placed in this encounter.     Procedures: No procedures performed   Clinical Data: No additional findings.  Objective: Vital Signs: There were no vitals taken for this visit.  Physical Exam:   Constitutional: Patient appears well-developed HEENT:  Head: Normocephalic Eyes:EOM are normal Neck: Normal range of motion Cardiovascular: Normal rate Pulmonary/chest: Effort normal Neurologic: Patient is alert Skin: Skin is warm Psychiatric: Patient has normal mood and  affect    Ortho Exam: Ortho exam demonstrates normal gait alignment with equal leg lengths and no real change in other physical exam findings.  Specialty Comments:  No specialty comments available.  Imaging: No results found.   PMFS History: Patient Active Problem List   Diagnosis Date Noted  . Insomnia 12/14/2018  . Refused influenza vaccine 12/14/2018  . Refused tetanus toxoid 12/14/2018  . Refused pneumococcal vaccination 12/14/2018  . Mammogram declined 12/14/2018   Past Medical History:  Diagnosis Date  . History of knee problem     Family History  Problem Relation Age of Onset  . Cancer Mother        lung cancer  . Alcohol abuse Mother   . Alcohol abuse Father     Past Surgical History:  Procedure Laterality Date  . BLADDER TUMOR EXCISION  2018  . left knee replacement     Social History   Occupational History  . Not on file  Tobacco Use  . Smoking status: Former Smoker    Types: Cigarettes    Quit date: 1999    Years since quitting: 22.2  . Smokeless tobacco: Never Used  Substance and Sexual Activity  . Alcohol use: Yes    Comment: 1 drink daily  . Drug use: Yes    Types: Marijuana  . Sexual activity: Not on file

## 2019-04-21 ENCOUNTER — Encounter: Payer: Self-pay | Admitting: Orthopedic Surgery

## 2019-04-21 ENCOUNTER — Ambulatory Visit: Payer: Medicare Other | Attending: Internal Medicine

## 2019-04-21 DIAGNOSIS — Z23 Encounter for immunization: Secondary | ICD-10-CM

## 2019-04-21 NOTE — Progress Notes (Signed)
   Covid-19 Vaccination Clinic  Name:  Tamara Estrada    MRN: [id number] DOB: Nov 07, 1955  04/21/2019  Tamara Estrada was observed post Covid-19 immunization for 15 minutes without incident. She was provided with Vaccine Information Sheet and instruction to access the V-Safe system.   Tamara Estrada was instructed to call 911 with any severe reactions post vaccine: Marland Kitchen Difficulty breathing  . Swelling of face and throat  . A fast heartbeat  . A bad rash all over body  . Dizziness and weakness   Immunizations Administered    Name Date Dose VIS Date Route   Pfizer COVID-19 Vaccine 04/21/2019 11:57 AM 0.3 mL 01/15/2019 Intramuscular   Manufacturer: [employer], [employer]   Lot: [id number]   NDC: [address]-9

## 2019-04-22 NOTE — Telephone Encounter (Signed)
Ok for scan

## 2019-04-26 ENCOUNTER — Encounter: Payer: Self-pay | Admitting: Pulmonary Disease

## 2019-04-26 ENCOUNTER — Ambulatory Visit (INDEPENDENT_AMBULATORY_CARE_PROVIDER_SITE_OTHER): Payer: Medicare Other | Admitting: Pulmonary Disease

## 2019-04-26 ENCOUNTER — Other Ambulatory Visit: Payer: Self-pay

## 2019-04-26 DIAGNOSIS — F5101 Primary insomnia: Secondary | ICD-10-CM | POA: Diagnosis not present

## 2019-04-26 NOTE — Progress Notes (Signed)
Subjective:    Patient ID: Tamara Estrada, female    DOB: Jun 24, 1955, 64 y.o.   MRN: [id number]  HPI  Chief Complaint  Patient presents with  . Consult    Patient is here for issues with sleeping. Patient states that she has had issues since childhood. Patient states it takes her 1-3 hours to fall asleep and she wakes up 3-4 times a night.     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 PCP notes were reviewed. She reports insomnia since childhood and ongoing for many years.  This is worsened after she had her daughter and again post menopause .  Over the years she has tried numerous natural remedies such as melatonin, valerian root, GABA  .  In terms of prescription medications, she has tried a combination of trazodone and Neurontin, Ambien did not seem to work, she has been maintained on lorazepam for many years.  She also drinks a martini and smokes pot to help her get to sleep. She has been diagnosed with an anxiety disorder since 1992 and has undergone psychotherapy for this .  It seems that for her getting 4 to 5 hours of sleep would be a "good night". Bedtime is between 10:11 PM, sleep latency can be up to 3 hours, occasionally reads in bed, and is only able to sleep as late as 2:58 AM, reports 3-4 nocturnal awakenings , resolved by 6 AM and will spend some time will lying down meditating before finally getting out of bed at 8 AM. She reports that her Fitbit does not seem to correlate with her perception of sleep-there are times when Fitbit indicates that she is getting more sleep because she is just lying in bed without moving and at other times it indicates less sleep. There is no history suggestive of cataplexy, sleep paralysis or parasomnias  There is no history of snoring or witnessed apneas.  There is no history of history of typical sensations in the legs  She quit smoking in 1999 and denies excessive use  of caffeinated beverages.  She does admit to using medical marijuana and drinking a martini at bedtime  Her mother died of lung cancer.  She is divorced, her daughter also seems to suffer from insomnia   Past Medical History:  Diagnosis Date  . History of knee problem    Past Surgical History:  Procedure Laterality Date  . BLADDER TUMOR EXCISION  2018  . left knee replacement      No Known Allergies   Review of Systems Constitutional: negative for anorexia, fevers and sweats  Eyes: negative for irritation, redness and visual disturbance  Ears, nose, mouth, throat, and face: negative for earaches, epistaxis, nasal congestion and sore throat  Respiratory: negative for cough, dyspnea on exertion, sputum and wheezing  Cardiovascular: negative for chest pain, dyspnea, lower extremity edema, orthopnea, palpitations and syncope  Gastrointestinal: negative for abdominal pain, constipation, diarrhea, melena, nausea and vomiting  Genitourinary:negative for dysuria, frequency and hematuria  Hematologic/lymphatic: negative for bleeding, easy bruising and lymphadenopathy  Musculoskeletal:negative for arthralgias, muscle weakness and stiff joints  Neurological: negative for coordination problems, gait problems, headaches and weakness  Endocrine: negative for diabetic symptoms including polydipsia, polyuria and weight loss     Objective:   Physical Exam  Gen. Pleasant, well-nourished, in no distress, anxious affect ENT - no pallor,icterus, no post nasal drip, no overbite Neck: No JVD, no thyromegaly, no carotid bruits Lungs:  no use of accessory muscles, no dullness to percussion, clear without rales or rhonchi  Cardiovascular: Rhythm regular, heart sounds  normal, no murmurs or gallops, no peripheral edema Abdomen: soft and non-tender, no hepatosplenomegaly, BS normal. Musculoskeletal: No deformities, no cyanosis or clubbing Neuro:  alert, non focal       Assessment & Plan:

## 2019-04-26 NOTE — Progress Notes (Signed)
Is this the new scan with more imaging?

## 2019-04-26 NOTE — Assessment & Plan Note (Signed)
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   We discussed -Realistic expectations, aiming for 4 hours of sleep per night -Sleep consolidation therapy  -Start with bedtime of 2 AM, wake up time with alarm 6 AM -Take lorazepam around 1 AM, no alcohol at least 3 to 4 hours before bedtime -No daytime naps  -Once able to sleep consistently for 4 hours for 3-4 nights, can decrease bedtime to 1:30 AM  Referral to insomnia therapist for cognitive behavioral therapy

## 2019-04-26 NOTE — Patient Instructions (Signed)
Rules of sleep hygiene were discussed  - light exercise -avoid caffeinated beverages - no more than 20 mins staying awake in bed, if not asleep, get out of bed & reading or light music - No TV or phone at bedtime.   We discussed -Realistic expectations, aiming for 4 hours of sleep per night -Sleep consolidation therapy  -Start with bedtime of 2 AM, wake up time with alarm 6 AM -Take lorazepam around 1 AM, no alcohol at least 3 to 4 hours before bedtime -No daytime naps  -Once able to sleep consistently for 4 hours for 3-4 nights, can decrease bedtime to 1:30 AM  Referral to insomnia therapist for cognitive behavioral therapy

## 2019-04-27 ENCOUNTER — Encounter: Payer: Self-pay | Admitting: Orthopedic Surgery

## 2019-04-28 ENCOUNTER — Encounter: Payer: Self-pay | Admitting: Orthopedic Surgery

## 2019-04-29 NOTE — Telephone Encounter (Signed)
I called.  She would like to get that excised.  Does not look like cancer.  She is having difficulty with sitting.  Plan for excision.  Risk and benefits are discussed including but limited to infection nerve vessel damage persistent pain.  All questions answered.

## 2019-05-05 ENCOUNTER — Encounter: Payer: Self-pay | Admitting: Orthopedic Surgery

## 2019-05-11 ENCOUNTER — Encounter: Payer: Self-pay | Admitting: Orthopedic Surgery

## 2019-05-14 ENCOUNTER — Encounter (HOSPITAL_BASED_OUTPATIENT_CLINIC_OR_DEPARTMENT_OTHER): Payer: Self-pay | Admitting: Orthopedic Surgery

## 2019-05-14 ENCOUNTER — Other Ambulatory Visit: Payer: Self-pay

## 2019-05-17 ENCOUNTER — Telehealth: Payer: Self-pay | Admitting: Orthopedic Surgery

## 2019-05-17 NOTE — Telephone Encounter (Signed)
[facility] called regarding patient scheduled for remove mass right hamstring April 19th (next Monday).  Dawn, RN with pre-admissions [phone]) states patient was under the impression she was having BILATERAL mass removal; in fact she does not want to have surgery unless it's both.  I tried to call patient this afternoon for clarification, but no answer. Please advise.

## 2019-05-17 NOTE — Telephone Encounter (Signed)
Pls advise. Thanks.  

## 2019-05-17 NOTE — Telephone Encounter (Signed)
There is a mass on the right side with calcium deposit.  She has a very faint calcium deposit on the left side but no palpable mass.  No indication for any left-sided surgery.

## 2019-05-17 NOTE — Telephone Encounter (Signed)
I called and talked with patient and advised per your message below. She states that you had discussed with her that the on the right it was approximately the size of walnut and on the left approximately the size of a hazelnut.  She said that they both are growing.  She does not want to be having surgery again after just short amount of time. She said she would like to discuss this further with you.  please call patient to further discuss [phone]

## 2019-05-19 ENCOUNTER — Encounter: Payer: Self-pay | Admitting: Orthopedic Surgery

## 2019-05-19 NOTE — Progress Notes (Signed)
Reached out to Dr. Alfonso Patten office today and left a voicemail. Patient would like to move forward with removal of mass, bilaterally not just on the right side.

## 2019-05-19 NOTE — Telephone Encounter (Signed)
Tried calling

## 2019-05-20 ENCOUNTER — Telehealth: Payer: Self-pay | Admitting: Orthopedic Surgery

## 2019-05-20 ENCOUNTER — Other Ambulatory Visit (HOSPITAL_COMMUNITY)
Admission: RE | Admit: 2019-05-20 | Discharge: 2019-05-20 | Disposition: A | Discharge status: Home / Self Care | Payer: Medicare Other | Source: Ambulatory Visit | Attending: Orthopedic Surgery | Admitting: Orthopedic Surgery

## 2019-05-20 DIAGNOSIS — Z20822 Contact with and (suspected) exposure to covid-19: Secondary | ICD-10-CM | POA: Diagnosis not present

## 2019-05-20 DIAGNOSIS — Z01812 Encounter for preprocedural laboratory examination: Secondary | ICD-10-CM | POA: Insufficient documentation

## 2019-05-20 LAB — SARS CORONAVIRUS 2 (TAT 6-24 HRS): SARS Coronavirus 2: NEGATIVE

## 2019-05-20 NOTE — Telephone Encounter (Signed)
Patient called. Would like for Dr. August Saucer or Franky Macho to call her. Says she missed a call from them. [phone]

## 2019-05-20 NOTE — Telephone Encounter (Signed)
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  Look for another number in the chart and could not find one.  Tamara Estrada can you try calling her at some point today thanks

## 2019-05-20 NOTE — Telephone Encounter (Signed)
Luke spoke with patient 

## 2019-05-21 ENCOUNTER — Other Ambulatory Visit: Payer: Self-pay

## 2019-05-21 ENCOUNTER — Encounter: Payer: Self-pay | Admitting: Orthopedic Surgery

## 2019-05-21 ENCOUNTER — Encounter (HOSPITAL_BASED_OUTPATIENT_CLINIC_OR_DEPARTMENT_OTHER)
Admission: RE | Admit: 2019-05-21 | Discharge: 2019-05-21 | Disposition: A | Discharge status: Home / Self Care | Payer: Medicare Other | Source: Ambulatory Visit | Attending: Orthopedic Surgery | Admitting: Orthopedic Surgery

## 2019-05-21 DIAGNOSIS — Z01812 Encounter for preprocedural laboratory examination: Secondary | ICD-10-CM | POA: Insufficient documentation

## 2019-05-21 LAB — BASIC METABOLIC PANEL
Anion gap: 11 (ref 5–15)
BUN: 18 mg/dL (ref 8–23)
CO2: 26 mmol/L (ref 22–32)
Calcium: 9.8 mg/dL (ref 8.9–10.3)
Chloride: 103 mmol/L (ref 98–111)
Creatinine, Ser: 0.57 mg/dL (ref 0.44–1.00)
GFR calc Af Amer: >60 mL/min (ref >60)
GFR calc non Af Amer: >60 mL/min (ref >60)
Glucose, Bld: 112 mg/dL — ABNORMAL HIGH (ref 70–99)
Potassium: 4.6 mmol/L (ref 3.5–5.1)
Sodium: 140 mmol/L (ref 135–145)

## 2019-05-21 LAB — CBC
HCT: 47.9 % — ABNORMAL HIGH (ref 36.0–46.0)
Hemoglobin: 15.8 g/dL — ABNORMAL HIGH (ref 12.0–15.0)
MCH: 31.1 pg (ref 26.0–34.0)
MCHC: 33 g/dL (ref 30.0–36.0)
MCV: 94.3 fL (ref 80.0–100.0)
Platelets: 276 10*3/uL (ref 150–400)
RBC: 5.08 MIL/uL (ref 3.87–5.11)
RDW: 13.6 % (ref 11.5–15.5)
WBC: 6.9 10*3/uL (ref 4.0–10.5)
nRBC: 0 % (ref 0.0–0.2)

## 2019-05-21 NOTE — Progress Notes (Signed)
Left message for pt to come in for lab work and pick up drink and soap.

## 2019-05-21 NOTE — Progress Notes (Signed)
PAT labs drawn. Pt refused her Ensure instructed her to drink 8oz of water in its place.

## 2019-05-21 NOTE — Telephone Encounter (Signed)
Thx sw

## 2019-05-24 ENCOUNTER — Ambulatory Visit (HOSPITAL_BASED_OUTPATIENT_CLINIC_OR_DEPARTMENT_OTHER): Payer: Medicare Other | Admitting: Certified Registered"

## 2019-05-24 ENCOUNTER — Ambulatory Visit (HOSPITAL_BASED_OUTPATIENT_CLINIC_OR_DEPARTMENT_OTHER)
Admission: RE | Admit: 2019-05-24 | Discharge: 2019-05-24 | Disposition: A | Discharge status: Home / Self Care | Payer: Medicare Other | Attending: Orthopedic Surgery | Admitting: Orthopedic Surgery

## 2019-05-24 ENCOUNTER — Encounter (HOSPITAL_BASED_OUTPATIENT_CLINIC_OR_DEPARTMENT_OTHER): Payer: Self-pay | Admitting: Orthopedic Surgery

## 2019-05-24 ENCOUNTER — Other Ambulatory Visit: Payer: Self-pay

## 2019-05-24 ENCOUNTER — Encounter (HOSPITAL_BASED_OUTPATIENT_CLINIC_OR_DEPARTMENT_OTHER)
Admission: RE | Disposition: A | Discharge status: Home / Self Care | Payer: Self-pay | Source: Home / Self Care | Attending: Orthopedic Surgery

## 2019-05-24 DIAGNOSIS — R2241 Localized swelling, mass and lump, right lower limb: Secondary | ICD-10-CM | POA: Diagnosis not present

## 2019-05-24 DIAGNOSIS — M6289 Other specified disorders of muscle: Secondary | ICD-10-CM | POA: Insufficient documentation

## 2019-05-24 DIAGNOSIS — Z79899 Other long term (current) drug therapy: Secondary | ICD-10-CM | POA: Diagnosis not present

## 2019-05-24 DIAGNOSIS — Z87891 Personal history of nicotine dependence: Secondary | ICD-10-CM | POA: Diagnosis not present

## 2019-05-24 HISTORY — PX: EXCISION MASS LOWER EXTREMETIES: SHX6705

## 2019-05-24 SURGERY — EXCISION MASS LOWER EXTREMITIES
Anesthesia: General | Site: Buttocks | Laterality: Right

## 2019-05-24 MED ORDER — ASPIRIN EC 81 MG PO TBEC: 81.0000 mg | DELAYED_RELEASE_TABLET | Freq: Every day | ORAL | 0 refills | Status: DC

## 2019-05-24 MED ORDER — PROPOFOL 10 MG/ML IV BOLUS
INTRAVENOUS | Status: DC | PRN
  Administered 2019-05-24: 200 mg via INTRAVENOUS

## 2019-05-24 MED ORDER — ONDANSETRON HCL 4 MG/2ML IJ SOLN
INTRAMUSCULAR | Status: DC | PRN
  Administered 2019-05-24: 4 mg via INTRAVENOUS

## 2019-05-24 MED ORDER — SUGAMMADEX SODIUM 200 MG/2ML IV SOLN
INTRAVENOUS | Status: DC | PRN
  Administered 2019-05-24: 200 mg via INTRAVENOUS

## 2019-05-24 MED ORDER — LIDOCAINE 2% (20 MG/ML) 5 ML SYRINGE
INTRAMUSCULAR | Status: AC
  Filled 2019-05-24: qty 5

## 2019-05-24 MED ORDER — ONDANSETRON HCL 4 MG/2ML IJ SOLN
INTRAMUSCULAR | Status: AC
  Filled 2019-05-24: qty 2

## 2019-05-24 MED ORDER — BUPIVACAINE HCL (PF) 0.5 % IJ SOLN
INTRAMUSCULAR | Status: DC | PRN
  Administered 2019-05-24: 30 mL

## 2019-05-24 MED ORDER — MIDAZOLAM HCL 2 MG/2ML IJ SOLN
INTRAMUSCULAR | Status: AC
  Filled 2019-05-24: qty 2

## 2019-05-24 MED ORDER — MORPHINE SULFATE 4 MG/ML IJ SOLN
INTRAMUSCULAR | Status: DC | PRN
  Administered 2019-05-24: 8 mg via INTRAVENOUS

## 2019-05-24 MED ORDER — FENTANYL CITRATE (PF) 100 MCG/2ML IJ SOLN
INTRAMUSCULAR | Status: AC
  Filled 2019-05-24: qty 2

## 2019-05-24 MED ORDER — POVIDONE-IODINE 10 % EX SWAB: 2.0000 "application " | Freq: Once | CUTANEOUS | Status: DC

## 2019-05-24 MED ORDER — PROPOFOL 10 MG/ML IV BOLUS
INTRAVENOUS | Status: AC
  Filled 2019-05-24: qty 20

## 2019-05-24 MED ORDER — CEFAZOLIN SODIUM-DEXTROSE 2-4 GM/100ML-% IV SOLN
INTRAVENOUS | Status: AC
  Filled 2019-05-24: qty 100

## 2019-05-24 MED ORDER — POVIDONE-IODINE 7.5 % EX SOLN: Freq: Once | CUTANEOUS | Status: DC

## 2019-05-24 MED ORDER — METHOCARBAMOL 500 MG PO TABS: 500.0000 mg | ORAL_TABLET | Freq: Three times a day (TID) | ORAL | 0 refills | Status: DC | PRN

## 2019-05-24 MED ORDER — LACTATED RINGERS IV SOLN: INTRAVENOUS | Status: DC

## 2019-05-24 MED ORDER — CLONIDINE HCL (ANALGESIA) 100 MCG/ML EP SOLN
EPIDURAL | Status: DC | PRN
  Administered 2019-05-24: .75 mL

## 2019-05-24 MED ORDER — MEPERIDINE HCL 25 MG/ML IJ SOLN: 6.2500 mg | INTRAMUSCULAR | Status: DC | PRN

## 2019-05-24 MED ORDER — ROCURONIUM BROMIDE 10 MG/ML (PF) SYRINGE
PREFILLED_SYRINGE | INTRAVENOUS | Status: DC | PRN
  Administered 2019-05-24: 80 mg via INTRAVENOUS

## 2019-05-24 MED ORDER — DEXAMETHASONE SODIUM PHOSPHATE 10 MG/ML IJ SOLN
INTRAMUSCULAR | Status: AC
  Filled 2019-05-24: qty 1

## 2019-05-24 MED ORDER — ONDANSETRON HCL 4 MG/2ML IJ SOLN: 4.0000 mg | Freq: Once | INTRAMUSCULAR | Status: DC | PRN

## 2019-05-24 MED ORDER — CEFAZOLIN SODIUM-DEXTROSE 2-4 GM/100ML-% IV SOLN
2.0000 g | INTRAVENOUS | Status: AC
  Administered 2019-05-24: 2 g via INTRAVENOUS

## 2019-05-24 MED ORDER — HYDROMORPHONE HCL 1 MG/ML IJ SOLN: 0.2500 mg | INTRAMUSCULAR | Status: DC | PRN

## 2019-05-24 MED ORDER — MIDAZOLAM HCL 2 MG/2ML IJ SOLN
1.0000 mg | INTRAMUSCULAR | Status: DC | PRN
  Administered 2019-05-24: 2 mg via INTRAVENOUS

## 2019-05-24 MED ORDER — FENTANYL CITRATE (PF) 100 MCG/2ML IJ SOLN
50.0000 ug | INTRAMUSCULAR | Status: AC | PRN
  Administered 2019-05-24 (×4): 50 ug via INTRAVENOUS

## 2019-05-24 MED ORDER — LIDOCAINE 2% (20 MG/ML) 5 ML SYRINGE
INTRAMUSCULAR | Status: DC | PRN
  Administered 2019-05-24: 100 mg via INTRAVENOUS

## 2019-05-24 MED ORDER — OXYCODONE HCL 5 MG PO TABS: 5.0000 mg | ORAL_TABLET | Freq: Four times a day (QID) | ORAL | 0 refills | Status: DC | PRN

## 2019-05-24 MED FILL — METHOCARBAMOL 500 MG TABS: 500 | 10 days supply | Qty: 30 | Fill #0

## 2019-05-24 MED FILL — oxyCODONE HCL 5 MG TABS: 5 | 7 days supply | Qty: 25 | Fill #0

## 2019-05-24 MED FILL — ASPIRIN 81 MG TBEC: 81 | 120 days supply | Qty: 120 | Fill #0

## 2019-05-24 SURGICAL SUPPLY — 59 items
BLADE SURG 15 STRL LF DISP TIS (BLADE) ×2 IMPLANT
BLADE SURG 15 STRL SS (BLADE) ×2
CANISTER SUCT 1200ML W/VALVE (MISCELLANEOUS) IMPLANT
CLSR STERI-STRIP ANTIMIC 1/2X4 (GAUZE/BANDAGES/DRESSINGS) IMPLANT
COVER WAND RF STERILE (DRAPES) IMPLANT
DECANTER SPIKE VIAL GLASS SM (MISCELLANEOUS) IMPLANT
DRAPE IMP U-DRAPE 54X76 (DRAPES) IMPLANT
DRAPE INCISE IOBAN 66X45 STRL (DRAPES) ×2 IMPLANT
DRAPE SURG 17X23 STRL (DRAPES) IMPLANT
DRAPE U-SHAPE 47X51 STRL (DRAPES) ×4 IMPLANT
DRAPE U-SHAPE 76X120 STRL (DRAPES) ×4 IMPLANT
DRSG AQUACEL AG ADV 3.5X 6 (GAUZE/BANDAGES/DRESSINGS) ×2 IMPLANT
DRSG AQUACEL AG ADV 3.5X10 (GAUZE/BANDAGES/DRESSINGS) IMPLANT
DRSG PAD ABDOMINAL 8X10 ST (GAUZE/BANDAGES/DRESSINGS) IMPLANT
DURAPREP 26ML APPLICATOR (WOUND CARE) ×2 IMPLANT
ELECT BLADE 4.0 EZ CLEAN MEGAD (MISCELLANEOUS)
ELECT REM PT RETURN 9FT ADLT (ELECTROSURGICAL) ×2
ELECTRODE BLDE 4.0 EZ CLN MEGD (MISCELLANEOUS) IMPLANT
ELECTRODE REM PT RTRN 9FT ADLT (ELECTROSURGICAL) ×1 IMPLANT
GAUZE 4X4 16PLY RFD (DISPOSABLE) IMPLANT
GAUZE XEROFORM 1X8 LF (GAUZE/BANDAGES/DRESSINGS) IMPLANT
GLOVE BIO SURGEON STRL SZ 6.5 (GLOVE) ×2 IMPLANT
GLOVE BIO SURGEON STRL SZ7 (GLOVE) ×2 IMPLANT
GLOVE BIOGEL PI IND STRL 7.0 (GLOVE) ×2 IMPLANT
GLOVE BIOGEL PI IND STRL 8 (GLOVE) ×1 IMPLANT
GLOVE BIOGEL PI INDICATOR 7.0 (GLOVE) ×2
GLOVE BIOGEL PI INDICATOR 8 (GLOVE) ×1
GLOVE ECLIPSE 6.5 STRL STRAW (GLOVE) ×2 IMPLANT
GLOVE SURG ORTHO 8.0 STRL STRW (GLOVE) ×2 IMPLANT
GOWN STRL REUS W/ TWL LRG LVL3 (GOWN DISPOSABLE) ×3 IMPLANT
GOWN STRL REUS W/TWL LRG LVL3 (GOWN DISPOSABLE) ×3
GOWN STRL REUS W/TWL XL LVL3 (GOWN DISPOSABLE) ×2 IMPLANT
MANIFOLD NEPTUNE II (INSTRUMENTS) ×2 IMPLANT
NS IRRIG 1000ML POUR BTL (IV SOLUTION) ×2 IMPLANT
PACK ARTHROSCOPY DSU (CUSTOM PROCEDURE TRAY) ×2 IMPLANT
PACK BASIN DAY SURGERY FS (CUSTOM PROCEDURE TRAY) ×2 IMPLANT
PENCIL SMOKE EVACUATOR (MISCELLANEOUS) ×2 IMPLANT
SHEET MEDIUM DRAPE 40X70 STRL (DRAPES) IMPLANT
SPONGE LAP 18X18 RF (DISPOSABLE) ×2 IMPLANT
STRIP CLOSURE SKIN 1/2X4 (GAUZE/BANDAGES/DRESSINGS) ×2 IMPLANT
SUCTION FRAZIER HANDLE 10FR (MISCELLANEOUS)
SUCTION TUBE FRAZIER 10FR DISP (MISCELLANEOUS) IMPLANT
SUT MNCRL AB 3-0 PS2 27 (SUTURE) ×2 IMPLANT
SUT VIC AB 0 CT1 27 (SUTURE) ×1
SUT VIC AB 0 CT1 27XCR 8 STRN (SUTURE) ×1 IMPLANT
SUT VIC AB 1 CT1 27 (SUTURE)
SUT VIC AB 1 CT1 27XBRD ANBCTR (SUTURE) IMPLANT
SUT VIC AB 2-0 CT1 27 (SUTURE) ×1
SUT VIC AB 2-0 CT1 TAPERPNT 27 (SUTURE) ×1 IMPLANT
SUT VIC AB 3-0 CT1 27 (SUTURE)
SUT VIC AB 3-0 CT1 27XBRD (SUTURE) IMPLANT
SUT VICRYL 0 UR6 27IN ABS (SUTURE) IMPLANT
SYR 20ML LL LF (SYRINGE) ×2 IMPLANT
SYR BULB 3OZ (MISCELLANEOUS) ×2 IMPLANT
SYR TB 1ML 25GX5/8 (SYRINGE) ×2 IMPLANT
TOWEL GREEN STERILE FF (TOWEL DISPOSABLE) ×4 IMPLANT
TUBE CONNECTING 20X1/4 (TUBING) IMPLANT
UNDERPAD 30X36 HEAVY ABSORB (UNDERPADS AND DIAPERS) ×2 IMPLANT
YANKAUER SUCT BULB TIP NO VENT (SUCTIONS) ×2 IMPLANT

## 2019-05-24 NOTE — Discharge Instructions (Signed)

## 2019-05-24 NOTE — Anesthesia Preprocedure Evaluation (Signed)
Anesthesia Evaluation  Patient identified by MRN, date of birth, ID band Patient awake    Reviewed: Allergy & Precautions, NPO status , Patient's Chart, lab work & pertinent test results  Airway Mallampati: I  TM Distance: >3 FB Neck ROM: Full    Dental   Pulmonary former smoker,    Pulmonary exam normal        Cardiovascular Normal cardiovascular exam     Neuro/Psych    GI/Hepatic   Endo/Other    Renal/GU      Musculoskeletal   Abdominal   Peds  Hematology   Anesthesia Other Findings   Reproductive/Obstetrics                             Anesthesia Physical Anesthesia Plan  ASA: II  Anesthesia Plan: General   Post-op Pain Management:    Induction: Intravenous  PONV Risk Score and Plan: 3 and Midazolam, Dexamethasone and Ondansetron  Airway Management Planned: Oral ETT  Additional Equipment:   Intra-op Plan:   Post-operative Plan: Extubation in OR  Informed Consent: I have reviewed the patients History and Physical, chart, labs and discussed the procedure including the risks, benefits and alternatives for the proposed anesthesia with the patient or authorized representative who has indicated his/her understanding and acceptance.       Plan Discussed with: CRNA and Surgeon  Anesthesia Plan Comments:         Anesthesia Quick Evaluation

## 2019-05-24 NOTE — H&P (Signed)
Tamara Estrada is an 64 y.o. female.   Chief Complaint: Right hamstring mass HPI: Tamara Estrada is a 64 year old patient with mild pain when sitting.  Noted to have mobile encapsulated mass near the gluteal crease on the right-hand side.  Calcifications are present on plain radiographs and early calcification also noted on the left-hand side but she has no symptoms on the side.  Denies any history of injury to the hamstrings.  MRI scan shows encapsulated mass which has some calcium within it but does not appear to be malignant.  No personal or family history of DVT or pulmonary embolism.  Past Medical History:  Diagnosis Date  . History of knee problem     Past Surgical History:  Procedure Laterality Date  . BLADDER TUMOR EXCISION  2018  . left knee replacement      Family History  Problem Relation Age of Onset  . Cancer Mother        lung cancer  . Alcohol abuse Mother   . Alcohol abuse Father    Social History:  reports that she quit smoking about 22 years ago. Her smoking use included cigarettes. She has never used smokeless tobacco. She reports current alcohol use of about 2.0 standard drinks of alcohol per week. She reports current drug use. Drug: Marijuana.  Allergies: No Known Allergies    No results found for this or any previous visit (from the past 48 hour(s)). No results found.  Review of Systems  Musculoskeletal: Positive for arthralgias.  All other systems reviewed and are negative.   Height 6\' 1"  (1.854 m), weight 79.4 kg. Physical Exam  Constitutional: She appears well-developed.  HENT:  Head: Normocephalic.  Eyes: Pupils are equal, round, and reactive to light.  Cardiovascular: Normal rate.  Respiratory: Effort normal.  Musculoskeletal:     Cervical back: Normal range of motion.  Neurological: She is alert.  Skin: Skin is warm.  Psychiatric: She has a normal mood and affect.  Examination of the right leg demonstrates a walnut sized mass in the gluteal  crease right-hand side.  Hamstring strength is excellent.  Mass is slightly tender.  Patient has good plantarflexion strength on the right-hand side.  No similar mass on the left-hand side.  Knee and hip examination otherwise normal  Assessment/Plan Impression is encapsulated walnut size mass gluteal fold near hamstring attachment site on the right-hand side.  MRI scan does not indicate any type of malignancy.  Calcified and well encapsulated on plain radiographs.  Plan is excision of this mass.  Risk benefits are discussed including not limited to infection nerve vessel damage potential recurrence as well as potential need for more surgery.  All questions answered  , MD 05/24/2019, 7:22 AM

## 2019-05-24 NOTE — Anesthesia Postprocedure Evaluation (Signed)
Anesthesia Post Note  Patient: Tamara Estrada  Procedure(s) Performed: Excision mass right hamstring (Right Buttocks)     Patient location during evaluation: PACU Anesthesia Type: General Level of consciousness: awake and alert Pain management: pain level controlled Vital Signs Assessment: post-procedure vital signs reviewed and stable Respiratory status: spontaneous breathing, nonlabored ventilation, respiratory function stable and patient connected to nasal cannula oxygen Cardiovascular status: blood pressure returned to baseline and stable Postop Assessment: no apparent nausea or vomiting Anesthetic complications: no    Last Vitals:  Vitals:   05/24/19 1100 05/24/19 1109  BP: (!) 152/91 (!) 163/96  Pulse: 73 70  Resp: 11 14  Temp:  36.5 C  SpO2: 96% 98%    Last Pain:  Vitals:   05/24/19 1109  TempSrc:   PainSc: 0-No pain                 [name] DAVID

## 2019-05-24 NOTE — Transfer of Care (Signed)
Immediate Anesthesia Transfer of Care Note  Patient: Tamara Estrada  Procedure(s) Performed: Excision mass right hamstring (Right Buttocks)  Patient Location: PACU  Anesthesia Type:General  Level of Consciousness: awake, alert , oriented and patient cooperative  Airway & Oxygen Therapy: Patient Spontanous Breathing and Patient connected to face mask oxygen  Post-op Assessment: Report given to RN, Post -op Vital signs reviewed and stable and Patient moving all extremities  Post vital signs: Reviewed and stable  Last Vitals:  Vitals Value Taken Time  BP 158/85 05/24/19 1015  Temp    Pulse 87 05/24/19 1014  Resp 17 05/24/19 1014  SpO2 100 % 05/24/19 1014  Vitals shown include unvalidated device data.  Last Pain:  Vitals:   05/24/19 1012  TempSrc:   PainSc: (P) 0-No pain         Complications: No apparent anesthesia complications

## 2019-05-24 NOTE — Op Note (Signed)
NAMEBRYLEY, CHRISMAN MEDICAL RECORD [id number] ACCOUNT [id number] DATE OF BIRTH:1955-11-27 FACILITY: [facility] LOCATION: [facility] PHYSICIAN:Elye Harmsen Diamantina Providence, MD  OPERATIVE REPORT  DATE OF PROCEDURE:  05/24/2019  PREOPERATIVE DIAGNOSIS:  Right posterior hamstring mass.  POSTOPERATIVE DIAGNOSIS:  Right posterior hamstring mass.  PROCEDURE:  Removal of right posterior hamstring region mass which was primarily in the subcutaneous tissue extending down to, but not through the fascia.  SURGEON:  Dorene Grebe, MD  ASSISTANT:  Karenann Cai, PA  INDICATIONS:  Tamara Estrada is a 64 year old patient with calcification on plain radiographs and mobile mass in the hamstring region around the ischial tuberosity.  Symptomatic for her when she is going up and down stairs as well as when she is sitting.   Presents now for operative management.  MRI scan consistent with calcified subcutaneous fat necrosis and no malignancy.  PROCEDURE IN DETAIL:  The patient was brought to the operating room where general anesthetic was induced.  Preoperative antibiotics administered.  Timeout was called.  The patient was placed prone on the operating table.  Right buttock area was  prescrubbed with alcohol and Betadine, allowed to air dry, prepped with DuraPrep solution and draped in a sterile manner.  Ioban used to cover the operative field.  Timeout was called.  Incision was made about 3 cm proximal to the gluteal crease in the  region where this mass was palpable.  About a 5 cm incision was made.  Skin and subcutaneous tissue were sharply divided.  The calcified nature of the subcutaneous tissue was palpable.  This was carefully dissected down to the fascia with care being  taken to avoid injury to the cluneal nerves and other superficial nerves when possible.  The mass was then excised.  It measured about 3.5 x 3.5 x 4 cm.  Sent to pathology for frozen section analysis.  Thorough irrigation was then performed and the skin   edges were anesthetized using Marcaine and morphine and clonidine.  The dead space was then closed using deep sutures of 0 Vicryl suture followed by interrupted inverted 2-0 Vicryl suture and 3-0 Monocryl.  Aquacel dressing applied.  The patient  tolerated the procedure well without immediate complications and was transferred to recovery in stable condition.  Luke's assistance was required for opening and closing, retraction.  His assistance was of medical necessity.  CN/NUANCE  D:05/24/2019 T:05/24/2019 JOB:[id number]

## 2019-05-24 NOTE — Telephone Encounter (Signed)
I do not understand the question.

## 2019-05-24 NOTE — Anesthesia Procedure Notes (Signed)
Procedure Name: Intubation Date/Time: 05/24/2019 9:06 AM Performed by: Myna Bright, CRNA Pre-anesthesia Checklist: Patient identified, Emergency Drugs available, Suction available and Patient being monitored Patient Re-evaluated:Patient Re-evaluated prior to induction Oxygen Delivery Method: Circle System Utilized Preoxygenation: Pre-oxygenation with 100% oxygen Induction Type: IV induction Ventilation: Mask ventilation without difficulty Laryngoscope Size: Mac and 3 Tube type: Oral Number of attempts: 1 Airway Equipment and Method: Stylet and Oral airway Placement Confirmation: ETT inserted through vocal cords under direct vision,  positive ETCO2 and breath sounds checked- equal and bilateral Secured at: 22 cm Tube secured with: Tape Dental Injury: Teeth and Oropharynx as per pre-operative assessment

## 2019-05-24 NOTE — Brief Op Note (Signed)
   05/24/2019  10:00 AM  PATIENT:  Tamara Estrada  63 y.o. female  PRE-OPERATIVE DIAGNOSIS:  Right hamstring mass  POST-OPERATIVE DIAGNOSIS:  Right hamstring mass  PROCEDURE:  Procedure(s): Excision mass right hamstring  SURGEON:  Surgeon(s): August Saucer, Corrie Mckusick, MD  ASSISTANT: magnant pa  ANESTHESIA:   general  EBL: 20 ml    Total I/O In: 1000 [I.V.:1000] Out: -   BLOOD ADMINISTERED: none  DRAINS: none   LOCAL MEDICATIONS USED:  Marcaine mso4 clonidine  SPECIMEN: mass to path COUNTS:  YES  TOURNIQUET:  * No tourniquets in log *  DICTATION: .Other Dictation: Dictation Number [id number]  PLAN OF CARE: Discharge to home after PACU  PATIENT DISPOSITION:  PACU - hemodynamically stable

## 2019-05-25 ENCOUNTER — Other Ambulatory Visit: Payer: Self-pay | Admitting: Family Medicine

## 2019-05-25 ENCOUNTER — Encounter: Payer: Self-pay | Admitting: *Deleted

## 2019-05-26 ENCOUNTER — Other Ambulatory Visit: Payer: Self-pay

## 2019-05-26 ENCOUNTER — Telehealth: Payer: Self-pay | Admitting: Orthopedic Surgery

## 2019-05-26 ENCOUNTER — Other Ambulatory Visit: Payer: Self-pay | Admitting: Family Medicine

## 2019-05-26 ENCOUNTER — Other Ambulatory Visit: Payer: Self-pay | Admitting: Orthopedic Surgery

## 2019-05-26 ENCOUNTER — Encounter: Payer: Self-pay | Admitting: Orthopedic Surgery

## 2019-05-26 MED ORDER — HYDROCODONE-ACETAMINOPHEN 5-325 MG PO TABS: 1.0000 | ORAL_TABLET | Freq: Four times a day (QID) | ORAL | 0 refills | Status: DC | PRN

## 2019-05-26 MED ORDER — TRAZODONE HCL 50 MG PO TABS: ORAL_TABLET | ORAL | 0 refills | Status: DC

## 2019-05-26 MED ORDER — TRAZODONE HCL 50 MG PO TABS: ORAL_TABLET | ORAL | 1 refills | Status: DC

## 2019-05-26 MED FILL — traZODone HCL 50 MG TABS: 50 | 30 days supply | Qty: 105 | Fill #0

## 2019-05-26 MED FILL — LORAZEPAM 1 MG TABS: 1 | 30 days supply | Qty: 30 | Fill #1

## 2019-05-26 NOTE — Telephone Encounter (Signed)
Patient called to check on the RX for her Norco.  CB#[id number].  Thank you.

## 2019-05-26 NOTE — Telephone Encounter (Signed)
Dr August Saucer submitted this earlier today. Patient has been made aware.

## 2019-05-26 NOTE — Telephone Encounter (Signed)
I called.

## 2019-05-27 ENCOUNTER — Encounter: Payer: Self-pay | Admitting: Orthopedic Surgery

## 2019-05-27 LAB — SURGICAL PATHOLOGY

## 2019-05-27 MED FILL — HYDROCODON-APAP 5-325: 5-325 | 5 days supply | Qty: 40 | Fill #0

## 2019-05-31 ENCOUNTER — Encounter: Payer: Self-pay | Admitting: Orthopedic Surgery

## 2019-05-31 ENCOUNTER — Ambulatory Visit (INDEPENDENT_AMBULATORY_CARE_PROVIDER_SITE_OTHER): Payer: Medicare Other | Admitting: Orthopedic Surgery

## 2019-05-31 ENCOUNTER — Other Ambulatory Visit: Payer: Self-pay

## 2019-05-31 VITALS — Ht 73.0 in | Wt 184.0 lb

## 2019-05-31 DIAGNOSIS — M619 Calcification and ossification of muscle, unspecified: Secondary | ICD-10-CM

## 2019-06-05 ENCOUNTER — Encounter: Payer: Self-pay | Admitting: Orthopedic Surgery

## 2019-06-05 ENCOUNTER — Other Ambulatory Visit: Payer: Self-pay | Admitting: Orthopedic Surgery

## 2019-06-05 NOTE — Progress Notes (Signed)
   Post-Op Visit Note   Patient: Tamara Estrada           Date of Birth: 07-May-1955           MRN: [id number] Visit Date: 05/31/2019 PCP: Sharlene Dory, DO   Assessment & Plan:  Chief Complaint:  Chief Complaint  Patient presents with  . Right Leg - Routine Post Op   Visit Diagnoses:  1. Calcification and ossification of muscle     Plan: Patient is now about 2 weeks out from right gluteal mass excision.  Incision is healing well.  Pain is improving.  Still hard for her to sit evenly.  I think that is going to improve over the next 4 weeks.  Continue with activities of daily living.  Pathology was benign.  Follow-up in 4 weeks for final check.  Follow-Up Instructions: No follow-ups on file.   Orders:  No orders of the defined types were placed in this encounter.  No orders of the defined types were placed in this encounter.   Imaging: No results found.  PMFS History: Patient Active Problem List   Diagnosis Date Noted  . Insomnia 12/14/2018  . Refused influenza vaccine 12/14/2018  . Refused tetanus toxoid 12/14/2018  . Refused pneumococcal vaccination 12/14/2018  . Mammogram declined 12/14/2018   Past Medical History:  Diagnosis Date  . History of knee problem     Family History  Problem Relation Age of Onset  . Cancer Mother        lung cancer  . Alcohol abuse Mother   . Alcohol abuse Father     Past Surgical History:  Procedure Laterality Date  . BLADDER TUMOR EXCISION  2018  . EXCISION MASS LOWER EXTREMETIES Right 05/24/2019   Procedure: Excision mass right hamstring;  Surgeon: Cammy Copa, MD;  Location: [facility];  Service: Orthopedics;  Laterality: Right;  . left knee replacement     Social History   Occupational History  . Not on file  Tobacco Use  . Smoking status: Former Smoker    Types: Cigarettes    Quit date: 1999    Years since quitting: 22.3  . Smokeless tobacco: Never Used  Substance and Sexual Activity   . Alcohol use: Yes    Alcohol/week: 2.0 standard drinks    Types: 2 Glasses of wine per week    Comment: 1 drink daily  . Drug use: Yes    Types: Marijuana  . Sexual activity: Not on file

## 2019-06-07 ENCOUNTER — Other Ambulatory Visit: Payer: Self-pay | Admitting: Surgical

## 2019-06-07 MED ORDER — HYDROCODONE-ACETAMINOPHEN 5-325 MG PO TABS: 1.0000 | ORAL_TABLET | Freq: Three times a day (TID) | ORAL | 0 refills | Status: DC | PRN

## 2019-06-07 NOTE — Telephone Encounter (Signed)
Dean patient 

## 2019-06-07 NOTE — Telephone Encounter (Signed)
Ok to rf? 

## 2019-06-07 NOTE — Telephone Encounter (Signed)
submitted

## 2019-06-07 NOTE — Telephone Encounter (Signed)
Submitted RX 

## 2019-06-09 ENCOUNTER — Encounter: Payer: Self-pay | Admitting: Orthopedic Surgery

## 2019-06-10 ENCOUNTER — Telehealth: Payer: Self-pay

## 2019-06-10 NOTE — Telephone Encounter (Signed)
I spoke with patient and advised that I submitted the PA to [email or web address] and her insurance has denied it.  She will try to reach out to pharmacy and pay out of pocket.

## 2019-08-03 DIAGNOSIS — F411 Generalized anxiety disorder: Secondary | ICD-10-CM

## 2019-08-03 MED ORDER — LORAZEPAM 1 MG PO TABS: 1.0000 mg | ORAL_TABLET | Freq: Every day | ORAL | 3 refills | Status: DC | PRN

## 2019-08-03 MED FILL — LORazepam 1 MG TABS: 1 | 30 days supply | Qty: 30 | Fill #0

## 2019-09-06 ENCOUNTER — Telehealth: Payer: Self-pay | Admitting: Family Medicine

## 2019-09-06 ENCOUNTER — Encounter: Payer: Self-pay | Admitting: Family Medicine

## 2019-09-06 ENCOUNTER — Other Ambulatory Visit: Payer: Self-pay

## 2019-09-06 ENCOUNTER — Ambulatory Visit (INDEPENDENT_AMBULATORY_CARE_PROVIDER_SITE_OTHER): Payer: Medicare Other | Admitting: Family Medicine

## 2019-09-06 VITALS — BP 140/82 | HR 90 | Temp 98.0°F | Ht 73.0 in | Wt 178.0 lb

## 2019-09-06 DIAGNOSIS — F411 Generalized anxiety disorder: Secondary | ICD-10-CM | POA: Diagnosis not present

## 2019-09-06 DIAGNOSIS — F321 Major depressive disorder, single episode, moderate: Secondary | ICD-10-CM | POA: Diagnosis not present

## 2019-09-06 MED ORDER — TRAZODONE HCL 50 MG PO TABS: 25.0000 mg | ORAL_TABLET | Freq: Every evening | ORAL | 3 refills | Status: DC | PRN

## 2019-09-06 MED ORDER — DULOXETINE HCL 30 MG PO CPEP: 60.0000 mg | ORAL_CAPSULE | Freq: Every day | ORAL | 1 refills | Status: DC

## 2019-09-06 MED ORDER — LORAZEPAM 1 MG PO TABS: 1.0000 mg | ORAL_TABLET | Freq: Every day | ORAL | 0 refills | Status: DC | PRN

## 2019-09-06 MED ORDER — TRAZODONE HCL 50 MG PO TABS: ORAL_TABLET | ORAL | 0 refills | Status: DC

## 2019-09-06 MED ORDER — DULOXETINE HCL 30 MG PO CPEP: 30.0000 mg | ORAL_CAPSULE | Freq: Every day | ORAL | 3 refills | Status: DC

## 2019-09-06 MED FILL — DULoxetine HCL 30 MG CPEP: 30 | 90 days supply | Qty: 180 | Fill #0

## 2019-09-06 MED FILL — traZODone HCL 50 MG TABS: 50 | 53 days supply | Qty: 105 | Fill #0

## 2019-09-06 MED FILL — LORAZEPAM 1 MG TABS: 1 | 30 days supply | Qty: 30 | Fill #0

## 2019-09-06 NOTE — Progress Notes (Signed)
Chief Complaint  Patient presents with  . Follow-up    Subjective Tamara Estrada presents for f/u anxiety/depression.  Pt is currently being treated with lorazepam 1 mg daily prn.  Over past couple mo, has been having much worsening s/s's. Has been using more Ativan.  No thoughts of harming self or others. No self-medication with alcohol, prescription drugs or illicit drugs.  Has been walking and swimming.  Pt is following with a counselor/psychologist.  Past Medical History:  Diagnosis Date  . History of knee problem    Allergies as of 09/06/2019   No Known Allergies     Medication List       Accurate as of September 06, 2019 12:04 PM. If you have any questions, ask your nurse or doctor.        STOP taking these medications   aspirin EC 81 MG tablet Stopped by: Sharlene Dory, DO   GABADONE PO Stopped by: Sharlene Dory, DO   HYDROcodone-acetaminophen 5-325 MG tablet Commonly known as: Norco Stopped by: Sharlene Dory, DO   LIVER & KIDNEY CLEANSER PO Stopped by: Sharlene Dory, DO   methocarbamol 500 MG tablet Commonly known as: Robaxin Stopped by: Sharlene Dory, DO   NON FORMULARY Stopped by: Sharlene Dory, DO   oxyCODONE 5 MG immediate release tablet Commonly known as: Roxicodone Stopped by: Sharlene Dory, DO   UNABLE TO FIND Stopped by: Sharlene Dory, DO   UNABLE TO FIND Stopped by: Sharlene Dory, DO     TAKE these medications   B COMPLEX 1 PO Take by mouth.   DIGESTIVE ENZYME PO Take by mouth.   DULoxetine 30 MG capsule Commonly known as: Cymbalta Take 2 capsules (60 mg total) by mouth daily. Started by: Sharlene Dory, DO   IMMUNE SUPPORT PO Take by mouth.   LORazepam 1 MG tablet Commonly known as: ATIVAN Take 1 tablet (1 mg total) by mouth daily as needed for anxiety. Take as needed   PROBIOTIC PO Take by mouth.   traZODone 50 MG tablet Commonly  known as: DESYREL Take 3.5 tablets at night as needed for sleep What changed: Another medication with the same name was added. Make sure you understand how and when to take each. Changed by: Sharlene Dory, DO   traZODone 50 MG tablet Commonly known as: DESYREL Take 0.5-1 tablets (25-50 mg total) by mouth at bedtime as needed for sleep. What changed: You were already taking a medication with the same name, and this prescription was added. Make sure you understand how and when to take each. Changed by: Sharlene Dory, DO   UNABLE TO FIND Med Name: Nerve 8 supplment       Exam BP (!) 140/82 (BP Location: Left Arm, Patient Position: Sitting, Cuff Size: Normal)   Pulse 90   Temp 98 F (36.7 C) (Oral)   Ht 6\' 1"  (1.854 m)   Wt 178 lb (80.7 kg)   SpO2 98%   BMI 23.48 kg/m  General:  well developed, well nourished, in no apparent distress Lungs:  No respiratory distress Psych: well oriented with normal range of affect and age-appropriate judgement/insight, alert and oriented x4.  Assessment and Plan  GAD (generalized anxiety disorder) - Plan: traZODone (DESYREL) 50 MG tablet, LORazepam (ATIVAN) 1 MG tablet, DULoxetine (CYMBALTA) 30 MG capsule  Depression, major, single episode, moderate (HCC) - Plan: traZODone (DESYREL) 50 MG tablet, DULoxetine (CYMBALTA) 30 MG capsule  Cont trazodone prn. She  uses around 2 times per week. I don't think this will interfere with her duloxetine initiation. Cont Ativan, try to wean down usage. Psych resources provided. Cont w therapy.  F/u in 6 weeks, she is visiting [place] for next 3 mo soon though. The patient voiced understanding and agreement to the plan.  Jilda Roche Krebs, DO 09/06/19 12:04 PM

## 2019-09-06 NOTE — Patient Instructions (Signed)
Keep the diet clean and stay active.  [facility] [address] [place], [place] [address] [phone]  [facility] [address] [place], [place] [address] [phone]  [facility] [address] [place], [place] [address] [phone]  [facility] [address], [place], [place], [phone] [address], [place], [place], #[phone]  [facility] [address] [place], [place] [address] [phone]  [facility] [address] [place], [place] [phone]  [facility] [address] [place], [place] [phone]  Call one of these offices sooner than later as it can take 2-3 months to get a new patient appointment.

## 2019-09-06 NOTE — Telephone Encounter (Signed)
Patient received a bill for OV--12/14/2018 with Dr. Carmelia Roller The patient received a bill for $180. Questions she has- What information put on claim? Billed as a Routine exam and Why? She wants to know what is was suppose to be billed for in order to be covered by medicare? Patient states was not coded correctly to be covered by Medicare and would like this looked into. Please call the patient with this information and thank you.

## 2019-09-08 NOTE — Telephone Encounter (Signed)
Sent to billing for review. [id number] was billed and is not covered by Medicare.

## 2019-09-17 ENCOUNTER — Encounter: Payer: Self-pay | Admitting: Obstetrics & Gynecology

## 2019-09-17 ENCOUNTER — Other Ambulatory Visit: Payer: Self-pay

## 2019-09-17 ENCOUNTER — Other Ambulatory Visit (HOSPITAL_COMMUNITY)
Admission: RE | Admit: 2019-09-17 | Discharge: 2019-09-17 | Disposition: A | Discharge status: Home / Self Care | Payer: Medicare Other | Source: Ambulatory Visit | Attending: Obstetrics & Gynecology | Admitting: Obstetrics & Gynecology

## 2019-09-17 ENCOUNTER — Ambulatory Visit (INDEPENDENT_AMBULATORY_CARE_PROVIDER_SITE_OTHER): Payer: Medicare Other | Admitting: Obstetrics & Gynecology

## 2019-09-17 VITALS — BP 126/87 | HR 86 | Ht 73.0 in | Wt 176.0 lb

## 2019-09-17 DIAGNOSIS — N879 Dysplasia of cervix uteri, unspecified: Secondary | ICD-10-CM | POA: Insufficient documentation

## 2019-09-17 DIAGNOSIS — Z1239 Encounter for other screening for malignant neoplasm of breast: Secondary | ICD-10-CM

## 2019-09-17 DIAGNOSIS — Z01419 Encounter for gynecological examination (general) (routine) without abnormal findings: Secondary | ICD-10-CM

## 2019-09-17 NOTE — Progress Notes (Signed)
Subjective:     Tamara Estrada is a 64 y.o. female here for a routine exam. LMP: 13 years prev. Did have bleeding in 2012. No further bleeding nothed. her2    Current complaints: no issues. Pt with h/o cervical dysplasia. She has taken so cannabis product and medicinal mushrooms and other vitamins and some [place] medicine. She has a Loss adjuster, chartered that she has seen for years.   Pt is moving to [place] to a small area that is COVID free. She has received her COVID vaccine. 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     Gynecologic History No LMP recorded. Patient is postmenopausal. Contraception: post menopausal status Last Pap: 1 year prev- normal. H/s cervical dysplasia 2 years prev. Last mammogram: pt has not had a mammogram for years. She does BSE and will be leaving for [place] in 2 days so will not get one at this time. She feels that she will get it if needed.   Obstetric History OB History  Gravida Para Term Preterm AB Living  _0 SAB TAB Ectopic Multiple Live Births    2     1    # Outcome Date GA Lbr Len/2nd Weight Sex Delivery Anes PTL Lv  3 Term 2 [redacted]w[redacted]d  F CS-LTranv EPI N LIV  2 TAB 1990          1 TAB 1973           The following portions of the patient's history were reviewed and updated as appropriate: allergies, current medications, past family history, past medical history, past social history, past surgical history and problem list.  Review of Systems Pertinent items are noted in HPI.    Objective:  BP 126/87   Pulse 86   Ht 6' 1" (1.854 m)   Wt 176 lb (79.8 kg)   BMI 23.22 kg/m   General Appearance:    Alert, cooperative, no distress, appears stated age  Head:    Normocephalic, without obvious abnormality, atraumatic  Eyes:    conjunctiva/corneas clear, EOM's intact, both eyes  Ears:    Normal external ear canals, both ears  Nose:   Nares normal, septum midline, mucosa normal, no drainage    or sinus tenderness   Throat:   Lips, mucosa, and tongue normal; teeth and gums normal  Neck:   Supple, symmetrical, trachea midline, no adenopathy;    thyroid:  no enlargement/tenderness/nodules  Back:     Symmetric, no curvature, ROM normal, no CVA tenderness  Lungs:     respirations unlabored  Chest Wall:    No tenderness or deformity   Heart:    Regular rate and rhythm  Breast Exam:    No tenderness, masses, or nipple abnormality  Abdomen:     Soft, non-tender, bowel sounds active all four quadrants,    no masses, no organomegaly; well healed RLQ incision  Genitalia:    Normal female without lesion, discharge or tenderness   Uterus small and mobile.   Extremities:   Extremities normal, atraumatic, no cyanosis or edema  Pulses:   2+ and symmetric all extremities  Skin:   Skin color, texture, turgor normal, no rashes or lesions    Assessment:    Healthy female exam.   Breast cancer screening- pt counseled about the benefits of breast cancer screening by mammogram.   Cervical dysplasia- f/u PAP and hrHPV    Plan:   F/u PAP and  hrHPV Declines mammogram at this time F/u in 1 year or sooner prn  Ines Rebel L. Harraway-Smith, M.D., Cherlynn June

## 2019-09-20 LAB — CYTOLOGY - PAP
Comment: NEGATIVE
Diagnosis: NEGATIVE
High risk HPV: NEGATIVE

## 2019-11-22 ENCOUNTER — Telehealth: Payer: Self-pay

## 2019-11-22 NOTE — Telephone Encounter (Signed)
Will submit for gel injection, right knee.

## 2019-11-24 ENCOUNTER — Telehealth: Payer: Self-pay | Admitting: Orthopedic Surgery

## 2019-11-24 NOTE — Telephone Encounter (Signed)
Patient called. She would like to have gel injection in her knee. Her call back number is [phone]

## 2019-11-24 NOTE — Telephone Encounter (Signed)
I see that you submit a request for gel injection on Monday just forwarding to you.

## 2019-11-26 NOTE — Telephone Encounter (Signed)
Noted  

## 2019-11-29 ENCOUNTER — Telehealth: Payer: Self-pay

## 2019-11-29 NOTE — Telephone Encounter (Signed)
Submitted VOB, SynviscOne, right knee. 

## 2019-12-07 ENCOUNTER — Other Ambulatory Visit: Payer: Self-pay

## 2019-12-07 ENCOUNTER — Other Ambulatory Visit: Payer: Self-pay | Admitting: Family Medicine

## 2019-12-07 DIAGNOSIS — F411 Generalized anxiety disorder: Secondary | ICD-10-CM

## 2019-12-07 MED ORDER — LORAZEPAM 1 MG PO TABS: 1.0000 mg | ORAL_TABLET | Freq: Every day | ORAL | 0 refills | Status: DC | PRN

## 2019-12-07 MED FILL — LORazepam 1 MG TABS: 1 | 30 days supply | Qty: 30 | Fill #0

## 2019-12-07 NOTE — Telephone Encounter (Signed)
Called the patient and she did not have her calendar with her. She stated she would call back tomorrow to schedule an appointment with PCP.

## 2019-12-07 NOTE — Telephone Encounter (Signed)
Requesting: lorazepam 1mg  Contract: None UDS: None Last Visit: 09/06/2019 Next Visit: None scheduled Last Refill: 09/06/2019 #90 and 0RF Pt sig: 1 tab daily prn  Please Advise

## 2019-12-07 NOTE — Telephone Encounter (Signed)
Refill sent, please schedule her f/u with me if she is in the country. Ty.

## 2019-12-09 ENCOUNTER — Telehealth: Payer: Self-pay | Admitting: Lactation Services

## 2019-12-09 NOTE — Telephone Encounter (Signed)
Patient called and LM on Nurse voicemail. She reports Dr. Erin Fulling recommends Mammogram. She would like to request a MRI Mammogram as feels it is more accurate. Routed to [facility]

## 2019-12-14 ENCOUNTER — Telehealth: Payer: Self-pay

## 2019-12-14 NOTE — Telephone Encounter (Signed)
Left message for patient to return call to office to give message from Dr. Erin Fulling. Armandina Stammer NR

## 2019-12-14 NOTE — Telephone Encounter (Signed)
-----   Message from Willodean Rosenthal, MD sent at 12/13/2019  8:27 AM EST ----- Pt sent message requesting MRI mammogram. Please call pt. MRI imaging of the breast is not currently used for screening of asymptomatic pts. If the initial mammogram shows any issues, it can be ordered.   Thanks,  Clh-S

## 2019-12-14 NOTE — Telephone Encounter (Signed)
Called patient and relayed that Dr. Erin Fulling would recommend routine screening mammogram first before moving to MRI of the breast. Armandina Stammer RN

## 2019-12-16 ENCOUNTER — Other Ambulatory Visit (HOSPITAL_BASED_OUTPATIENT_CLINIC_OR_DEPARTMENT_OTHER): Payer: Self-pay | Admitting: Obstetrics & Gynecology

## 2019-12-16 DIAGNOSIS — Z1231 Encounter for screening mammogram for malignant neoplasm of breast: Secondary | ICD-10-CM

## 2019-12-17 ENCOUNTER — Telehealth: Payer: Self-pay

## 2019-12-17 NOTE — Telephone Encounter (Signed)
Approved, SynviscOne, right knee. [place] deductible has been met Covered at 100% after Medicare deductible pays No Co-pay No PA required

## 2019-12-24 ENCOUNTER — Other Ambulatory Visit: Payer: Self-pay

## 2019-12-24 ENCOUNTER — Ambulatory Visit (INDEPENDENT_AMBULATORY_CARE_PROVIDER_SITE_OTHER): Payer: Medicare Other | Admitting: Surgical

## 2019-12-24 ENCOUNTER — Ambulatory Visit (INDEPENDENT_AMBULATORY_CARE_PROVIDER_SITE_OTHER): Payer: Medicare Other

## 2019-12-24 DIAGNOSIS — M1711 Unilateral primary osteoarthritis, right knee: Secondary | ICD-10-CM | POA: Diagnosis not present

## 2019-12-24 DIAGNOSIS — M25561 Pain in right knee: Secondary | ICD-10-CM | POA: Diagnosis not present

## 2019-12-25 ENCOUNTER — Encounter: Payer: Self-pay | Admitting: Surgical

## 2019-12-25 DIAGNOSIS — M1711 Unilateral primary osteoarthritis, right knee: Secondary | ICD-10-CM

## 2019-12-25 MED ORDER — HYLAN G-F 20 48 MG/6ML IX SOSY
48.0000 mg | PREFILLED_SYRINGE | INTRA_ARTICULAR | Status: AC | PRN
  Administered 2019-12-25: 48 mg via INTRA_ARTICULAR

## 2019-12-25 MED ORDER — LIDOCAINE HCL 1 % IJ SOLN
5.0000 mL | INTRAMUSCULAR | Status: AC | PRN
  Administered 2019-12-25: 5 mL

## 2019-12-25 NOTE — Progress Notes (Signed)
Office Visit Note   Patient: Tamara Estrada           Date of Birth: 08-21-1955           MRN: [id number] Visit Date: 12/24/2019 Requested by: Sharlene Dory, DO [address] [place],  [place] [address] PCP: Sharlene Dory, DO  Subjective: Chief Complaint  Patient presents with  . Right Knee - Pain    HPI: Tamara Estrada is a 64 y.o. female who presents to the office complaining of right knee pain.  She complains of right knee pain that has been significantly worse in the last month.  Majority of her pain is medial.  She has been treated with coconut oil and essential oils and lemon grass and icing.  She states that it difficulty to put full weight on the leg at times.  She has a history of right knee osteoarthritis for which she has received gel injections that provided 4 to 5 months of relief.  She has had her left knee replaced but she is unsure if she is ready for right knee replacement.  Planning on going on a trip to [place] from December 2021 to May 2022.                ROS: All systems reviewed are negative as they relate to the chief complaint within the history of present illness.  Patient denies fevers or chills.  Assessment & Plan: Visit Diagnoses:  1. Right knee pain, unspecified chronicity   2. Arthritis of right knee     Plan: Patient is a 64 year old female presents complaining of right knee pain.  She has history of right knee osteoarthritis.  Radiographs of the right knee were taken today and showed no significant change compared with last set of right knee radiographs aside from some very mild progression of arthritis.  She does note increased pain last month but she denies any injury or inciting event.  Plan for administration of gel injection today.  She states that she does not want to try cortisone injection.  She tolerated the gel injection well.  She is planning to go on her trip to [place] until next May and then she will return and  consider right total knee arthroplasty.  Discussed the risks and benefits of the procedure.  She understands, especially with her prior history of left total knee arthroplasty.  Plan to follow-up in 2022.  Follow-Up Instructions: No follow-ups on file.   Orders:  Orders Placed This Encounter  Procedures  . XR KNEE 3 VIEW RIGHT   No orders of the defined types were placed in this encounter.     Procedures: Large Joint Inj: R knee on 12/25/2019 10:52 AM Indications: pain, joint swelling and diagnostic evaluation Details: 18 G 1.5 in needle, superolateral approach  Arthrogram: No  Medications: 5 mL lidocaine 1 %; 48 mg Hylan 48 MG/6ML Outcome: tolerated well, no immediate complications Procedure, treatment alternatives, risks and benefits explained, specific risks discussed. Consent was given by the patient. Immediately prior to procedure a time out was called to verify the correct patient, procedure, equipment, support staff and site/side marked as required. Patient was prepped and draped in the usual sterile fashion.       Clinical Data: No additional findings.  Objective: Vital Signs: There were no vitals taken for this visit.  Physical Exam:  Constitutional: Patient appears well-developed HEENT:  Head: Normocephalic Eyes:EOM are normal Neck: Normal range of motion Cardiovascular: Normal  rate Pulmonary/chest: Effort normal Neurologic: Patient is alert Skin: Skin is warm Psychiatric: Patient has normal mood and affect  Ortho Exam: Ortho exam demonstrates right knee without effusion.  Tenderness over the medial lateral joint lines.  Positive patellar grind test.  No ligamentous laxity to varus/valgus stress or anterior/posterior drawer.  No calf tenderness on exam.  Negative Homans' sign.  No pain with hip range of motion.  Negative Stinchfield exam.  Negative straight leg raise.  Specialty Comments:  No specialty comments available.  Imaging: No results  found.   PMFS History: Patient Active Problem List   Diagnosis Date Noted  . Insomnia 12/14/2018  . Refused influenza vaccine 12/14/2018  . Refused tetanus toxoid 12/14/2018  . Refused pneumococcal vaccination 12/14/2018  . Mammogram declined 12/14/2018   Past Medical History:  Diagnosis Date  . History of knee problem     Family History  Problem Relation Age of Onset  . Cancer Mother        lung cancer  . Alcohol abuse Mother   . Alcohol abuse Father     Past Surgical History:  Procedure Laterality Date  . BLADDER TUMOR EXCISION  2018  . EXCISION MASS LOWER EXTREMETIES Right 05/24/2019   Procedure: Excision mass right hamstring;  Surgeon: Cammy Copa, MD;  Location: [facility];  Service: Orthopedics;  Laterality: Right;  . left knee replacement     Social History   Occupational History  . Not on file  Tobacco Use  . Smoking status: Former Smoker    Types: Cigarettes    Quit date: 1999    Years since quitting: 22.9  . Smokeless tobacco: Never Used  Vaping Use  . Vaping Use: Never used  Substance and Sexual Activity  . Alcohol use: Yes    Alcohol/week: 2.0 standard drinks    Types: 2 Glasses of wine per week    Comment: 1 drink daily  . Drug use: Yes    Types: Marijuana  . Sexual activity: Not Currently

## 2019-12-27 ENCOUNTER — Ambulatory Visit: Payer: Medicare Other | Attending: Internal Medicine

## 2019-12-27 ENCOUNTER — Ambulatory Visit (INDEPENDENT_AMBULATORY_CARE_PROVIDER_SITE_OTHER): Payer: Medicare Other | Admitting: Family Medicine

## 2019-12-27 ENCOUNTER — Other Ambulatory Visit (HOSPITAL_BASED_OUTPATIENT_CLINIC_OR_DEPARTMENT_OTHER): Payer: Self-pay | Admitting: Internal Medicine

## 2019-12-27 ENCOUNTER — Encounter (HOSPITAL_BASED_OUTPATIENT_CLINIC_OR_DEPARTMENT_OTHER): Payer: Self-pay

## 2019-12-27 ENCOUNTER — Encounter: Payer: Self-pay | Admitting: Family Medicine

## 2019-12-27 ENCOUNTER — Ambulatory Visit (HOSPITAL_BASED_OUTPATIENT_CLINIC_OR_DEPARTMENT_OTHER)
Admission: RE | Admit: 2019-12-27 | Discharge: 2019-12-27 | Disposition: A | Discharge status: Home / Self Care | Payer: Medicare Other | Source: Ambulatory Visit | Attending: Obstetrics & Gynecology | Admitting: Obstetrics & Gynecology

## 2019-12-27 ENCOUNTER — Other Ambulatory Visit: Payer: Self-pay

## 2019-12-27 ENCOUNTER — Inpatient Hospital Stay: Admit: 2021-10-11 | Payer: MEDICARE | Primary: Physician

## 2019-12-27 VITALS — BP 118/66 | HR 74 | Temp 98.5°F | Ht 73.0 in | Wt 173.4 lb

## 2019-12-27 DIAGNOSIS — Z1231 Encounter for screening mammogram for malignant neoplasm of breast: Secondary | ICD-10-CM | POA: Insufficient documentation

## 2019-12-27 DIAGNOSIS — F411 Generalized anxiety disorder: Secondary | ICD-10-CM | POA: Diagnosis not present

## 2019-12-27 DIAGNOSIS — Z23 Encounter for immunization: Secondary | ICD-10-CM

## 2019-12-27 DIAGNOSIS — F5101 Primary insomnia: Secondary | ICD-10-CM | POA: Diagnosis not present

## 2019-12-27 DIAGNOSIS — H00012 Hordeolum externum right lower eyelid: Secondary | ICD-10-CM

## 2019-12-27 MED ORDER — DOXEPIN HCL 10 MG PO CAPS: 10.0000 mg | ORAL_CAPSULE | Freq: Every evening | ORAL | 3 refills | Status: DC | PRN

## 2019-12-27 MED FILL — DOXEPIN HCL 10 MG CAPS: 10 | 30 days supply | Qty: 30 | Fill #0

## 2019-12-27 MED FILL — MODERNA COVID-19 VACCINE 10: 100 | 1 days supply | Qty: 0 | Fill #0

## 2019-12-27 NOTE — Patient Instructions (Addendum)
If you do not hear anything about your referral in the next 1-2 weeks, call our office and ask for an update.  Good luck with your knee.  Let me know if you need refills or anything prior to leaving.  Send me a message on Monday if the stye is still bothersome. Warm compresses and artificial tears until then.   Let us know if you need anything.

## 2019-12-27 NOTE — Progress Notes (Signed)
Chief Complaint  Patient presents with  . Follow-up    Subjective Tamara Estrada presents for f/u anxiety/insomnia.  Pt is currently being treated with Cymbalta 60 mg daily and lorazepam 0.5-1 as needed milligram.  Reports doing a little better since treatment. She still sleeps around 3 hours nightly which is not refreshing for her.  Anxiety can still be bothersome and she is having intermittent panic attacks No thoughts of harming self or others. No self-medication with alcohol, prescription drugs or illicit drugs. The patient has been on trazodone and gabapentin.  The latter made her swell.  She was prescribed Ambien that did work, but she was not thrilled with the possibility of psychosis so she stopped taking it. Pt is not following with a counselor/psychologist.  The patient has had a stye on her right lower lid for the past 2 days.  She has been using colloidal silver to keep it clean.  It itches slightly.  There is no pain or vision change associated with this.  Past Medical History:  Diagnosis Date  . History of knee problem    Allergies as of 12/27/2019   No Known Allergies     Medication List       Accurate as of December 27, 2019 11:27 AM. If you have any questions, ask your nurse or doctor.        STOP taking these medications   traZODone 50 MG tablet Commonly known as: DESYREL Stopped by: Sharlene Dory, DO     TAKE these medications   B COMPLEX 1 PO Take by mouth.   DIGESTIVE ENZYME PO Take by mouth.   doxepin 10 MG capsule Commonly known as: SINEQUAN Take 1 capsule (10 mg total) by mouth at bedtime as needed. Started by: Sharlene Dory, DO   DULoxetine 30 MG capsule Commonly known as: Cymbalta Take 2 capsules (60 mg total) by mouth daily.   LORazepam 1 MG tablet Commonly known as: ATIVAN Take 1 tablet (1 mg total) by mouth daily as needed for anxiety. Take as needed   PROBIOTIC PO Take by mouth.   UNABLE TO FIND Med Name:  Nerve 8 supplment       Exam BP 118/66 (BP Location: Left Arm, Patient Position: Sitting, Cuff Size: Normal)   Pulse 74   Temp 98.5 F (36.9 C) (Oral)   Ht 6\' 1"  (1.854 m)   Wt 173 lb 6 oz (78.6 kg)   SpO2 94%   BMI 22.87 kg/m  General:  well developed, well nourished, in no apparent distress Eye: Sclera white, there is a small papule noted of the conjunctivo of the right lower lid; no discharge or edema noted Lungs:  No respiratory distress Psych: well oriented with normal range of affect and age-appropriate judgement/insight, alert and oriented x4.  Assessment and Plan  Primary insomnia - Plan: doxepin (SINEQUAN) 10 MG capsule, Ambulatory referral to Psychiatry  GAD (generalized anxiety disorder) - Plan: Ambulatory referral to Psychiatry  Hordeolum externum of right lower eyelid  1/2. add low-dose doxepin.  Refer to psychiatry. 3.  Warm compresses, massage toward the eyelash, send a message on Monday if no improvement.  Would consider erythromycin ointment for lubrication. F/u in 6 months as she is visiting Sunday for the next 5 months. The patient voiced understanding and agreement to the plan.  [place], DO 12/27/19 11:27 AM

## 2019-12-27 NOTE — Progress Notes (Signed)
   Covid-19 Vaccination Clinic  Name:  Lylee Corrow    MRN: [id number] DOB: [birth year]  12/27/2019  Ms. Azzarello was observed post Covid-19 immunization for 15 minutes without incident. She was provided with Vaccine Information Sheet and instruction to access the V-Safe system.   Ms. Ruark was instructed to call 911 with any severe reactions post vaccine: Marland Kitchen Difficulty breathing  . Swelling of face and throat  . A fast heartbeat  . A bad rash all over body  . Dizziness and weakness   Immunizations Administered    No immunizations on file.     Vaccine given by Arletha Pili, pharmacy student

## 2019-12-31 ENCOUNTER — Encounter (HOSPITAL_BASED_OUTPATIENT_CLINIC_OR_DEPARTMENT_OTHER): Payer: Medicare Other

## 2019-12-31 DIAGNOSIS — Z1231 Encounter for screening mammogram for malignant neoplasm of breast: Secondary | ICD-10-CM

## 2020-01-03 ENCOUNTER — Ambulatory Visit: Payer: Medicare Other | Admitting: Family Medicine

## 2020-01-05 ENCOUNTER — Ambulatory Visit: Payer: Medicare Other | Admitting: Orthopedic Surgery

## 2020-01-06 ENCOUNTER — Ambulatory Visit: Payer: Medicare Other | Admitting: Orthopedic Surgery

## 2020-01-12 ENCOUNTER — Other Ambulatory Visit: Payer: Self-pay

## 2020-01-12 ENCOUNTER — Ambulatory Visit: Payer: Medicare Other | Admitting: Orthopedic Surgery

## 2020-01-12 ENCOUNTER — Other Ambulatory Visit: Payer: Self-pay | Admitting: Family Medicine

## 2020-01-12 DIAGNOSIS — F411 Generalized anxiety disorder: Secondary | ICD-10-CM

## 2020-01-12 DIAGNOSIS — F321 Major depressive disorder, single episode, moderate: Secondary | ICD-10-CM

## 2020-01-12 MED ORDER — LORAZEPAM 1 MG PO TABS: 1.0000 mg | ORAL_TABLET | Freq: Every day | ORAL | 0 refills | Status: DC | PRN

## 2020-01-12 MED FILL — LORazepam 1 MG TABS: 1 | 30 days supply | Qty: 30 | Fill #0

## 2020-01-13 ENCOUNTER — Other Ambulatory Visit: Payer: Self-pay | Admitting: Family Medicine

## 2020-01-13 MED ORDER — DULOXETINE HCL 30 MG PO CPEP: 60.0000 mg | ORAL_CAPSULE | Freq: Every day | ORAL | 1 refills | Status: DC

## 2020-01-13 MED FILL — DULoxetine HCL 30 MG CPEP: 30 | 90 days supply | Qty: 180 | Fill #0

## 2020-02-15 ENCOUNTER — Ambulatory Visit: Admit: 2020-02-15 | Payer: MEDICARE | Attending: Physician | Primary: Physician

## 2020-02-15 DIAGNOSIS — Z0000 Encounter for general adult medical examination without abnormal findings: Secondary | ICD-10-CM

## 2020-02-15 DIAGNOSIS — Z780 Asymptomatic menopausal state: Secondary | ICD-10-CM

## 2020-02-15 DIAGNOSIS — R002 Palpitations: Secondary | ICD-10-CM

## 2020-02-15 DIAGNOSIS — Z01818 Encounter for other preprocedural examination: Secondary | ICD-10-CM

## 2020-02-15 DIAGNOSIS — R350 Frequency of micturition: Secondary | ICD-10-CM

## 2020-02-15 LAB — COMPLETE BLOOD COUNT WITH DIFF
Abs Basophils: 0.09 10*9/L (ref 0.00–0.10)
Abs Eosinophils: 0.42 10*9/L — ABNORMAL HIGH (ref 0.00–0.40)
Abs Imm Granulocytes: 0.03 10*9/L (ref <0.10)
Abs Lymphocytes: 2.33 10*9/L (ref 1.00–3.40)
Abs Monocytes: 0.64 10*9/L (ref 0.20–0.80)
Abs Neutrophils: 4.81 10*9/L (ref 1.80–6.80)
Hematocrit: 47.3 % — ABNORMAL HIGH (ref 36.0–46.0)
Hemoglobin: 15.3 g/dL (ref 12.0–15.5)
MCH: 29.8 pg (ref 26.0–34.0)
MCHC: 32.3 g/dL (ref 31.0–36.0)
MCV: 92 fL (ref 80–100)
Platelet Count: 271 10*9/L (ref 140–450)
RBC Count: 5.14 10*12/L (ref 4.00–5.20)
WBC Count: 8.3 10*9/L (ref 3.4–10.0)

## 2020-02-15 LAB — URINALYSIS WITH MICROSCOPY
Bilirubin, Urine: NEGATIVE
Glucose, (UA): NEGATIVE mg/dL
Hemoglobin (UA): NEGATIVE
Ketones, UA: NEGATIVE mg/dL
Nitrite: NEGATIVE
Protein, UA: NEGATIVE mg/dL
Specific Gravity: 1.014 (ref 1.002–1.030)
Urobilinogen: NEGATIVE mg/dL(EU/dL)
WBC Esterase: NEGATIVE
pH, UA: 6 (ref 4.5–8.0)

## 2020-02-15 LAB — PROTHROMBIN TIME
Int'l Normaliz Ratio: 1 (ref 0.9–1.2)
PT: 12.5 s (ref 11.7–14.9)

## 2020-02-15 LAB — COMPREHENSIVE METABOLIC PANEL
AST: 20 U/L (ref 5–44)
Alanine transaminase: 21 U/L (ref 10–61)
Albumin, Serum / Plasma: 4.3 g/dL (ref 3.4–4.8)
Alkaline Phosphatase: 66 U/L (ref 38–108)
Anion Gap: 12 (ref 4–14)
Bilirubin, Total: 0.4 mg/dL (ref 0.2–1.2)
Calcium, total, Serum / Plasma: 10 mg/dL (ref 8.4–10.5)
Carbon Dioxide, Total: 25 mmol/L (ref 22–29)
Chloride, Serum / Plasma: 101 mmol/L (ref 101–110)
Creatinine: 0.59 mg/dL (ref 0.55–1.02)
Glucose, non-fasting: 78 mg/dL (ref 70–199)
Potassium, Serum / Plasma: 4.7 mmol/L (ref 3.5–5.0)
Protein, Total, Serum / Plasma: 8 g/dL (ref 6.3–8.6)
Sodium, Serum / Plasma: 138 mmol/L (ref 135–145)
Urea Nitrogen, Serum / Plasma: 19 mg/dL (ref 7–25)
eGFR - high estimate: 111 mL/min (ref >59)
eGFR - low estimate: 96 mL/min (ref >59)

## 2020-02-15 MED ORDER — DULOXETINE 30 MG CAPSULE,DELAYED RELEASE: 30 mg | ORAL | Status: DC

## 2020-02-15 NOTE — Assessment & Plan Note (Addendum)
Gina Spence is a 65 y.o. female had concerns including Pre-op Exam.    HPI  Can walk 4 blocks without any symptoms of chest pain or shortness of breath.  Can usually do more, but activity limited by knee pain.    Procedure Planned: Right total knee arthroplasty  Procedure Date:  Prior Anesthesia experience/reactions: N/A  Bleeding disorders: N/A    Current Medications       Dosage    LORazepam (ATIVAN) 1 mg tablet Take 1 tablet (1 mg total) by mouth daily as needed for Anxiety    traZODone (DESYREL) 50 mg tablet Take 1 tablet (50 mg total) by mouth nightly at bedtime    DULoxetine (CYMBALTA) 30 mg DR capsule       Clinic-Administered Medications       Dosage    sodium hyaluronate (EUFLEXXA) 10 mg/mL(mw 2.4 -3.6 million) injection syringe 20 mg 20 mg, Intraarticular, Every 7 Days Scheduled, EUFLEXXA INJECTION #1 OUT OF 3          Past Medical History:   Diagnosis Date    Bladder cancer (CMS code)     Depression with anxiety     Osteoarthritis        Past Surgical History:   Procedure Laterality Date    REPLACEMENT TOTAL KNEE Left 12/2014    Dr Purcell Mouton at [facility]  01/2017    Bladder cancer       Family History   Problem Relation Name Age of Onset    Lung cancer Mother      Depression Mother      Alcohol abuse Mother      Liver cancer Father      Alcohol abuse Father         Social History     Socioeconomic History    Marital status: Divorced     Spouse name: Not on file    Number of children: Not on file    Years of education: Not on file    Highest education level: Not on file   Occupational History    Not on file   Tobacco Use    Smoking status: Never Smoker    Smokeless tobacco: Never Used   Substance and Sexual Activity    Alcohol use: Yes     Alcohol/week: 1.0 - 2.0 standard drink     Types: 1 - 2 Glasses of wine per week     Comment: A glass of wine every night     Drug use: Yes     Types: Marijuana     Comment: Once a day at bedtime.     Sexual  activity: Not on file   Other Topics Concern    Not on file   Social History Narrative    From [place]. Moved to [facility] in 1992.    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     Shares house with 3 tenants; she is on the top floor.     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         Allergies/Contraindications  No Known Allergies    Vitals:    02/15/20 1036 02/15/20 1038   BP: (!) 110/95 131/70   BP Location: Left upper arm Right upper arm   Patient Position: Sitting Sitting   Cuff Size: Adult Adult   Pulse: 93 92   Weight: 80.2 kg (176 lb 12.8 oz)        PHYSICAL EXAM  General: Alert.  Appears stated age.  Cooperative and conversant.  No acute distress.  Head: Normocephalic, without obvious abnormality.  Atraumatic.  Eyes: Anicteric sclera.Lids & lashes normal, normal conjunctiva.   Lungs: Normal respiratory effort, speaking complete sentences.  No wheezes, rales, or rhonchi in bilateral lung fields.  Heart: Regular rate and rhythm.  Normal S1 and S2.  No murmurs, rubs, clicks, or gallops.  Abdomen:  soft, nontender, nondistended, no masses, no guarding, no rigidity  Extremities: No lower extremity edema.  Psych: normal affect & mood  Skin: warm, dry, no rash, normal turgor.  Neuro: Alert & oriented x 3    ASSESSMENT AND PLAN  Ordered prothrombin panel.    [name] has been medically evaluated for the indicated procedure.  There is no contraindication to this procedure; recommended preop screening has been ordered; results will be addressed with the patient and this information will be forwarded to the surgical speciality office.       There are no diagnoses linked to this encounter.

## 2020-02-15 NOTE — Progress Notes (Signed)
Gina Spence is a 65 y.o. female patient here with a chief complaint of Pre-op Exam    Problem based HPI and assessment and plan      Problem List Items Addressed This Visit                               Other    Pre-op evaluation     Gina Spence is a 65 y.o. female had concerns including Pre-op Exam.    HPI  Can walk 4 blocks without any symptoms of chest pain or shortness of breath.  Can usually do more, but activity limited by knee pain.    Procedure Planned: Right total knee arthroplasty  Procedure Date:  Prior Anesthesia experience/reactions: none   Bleeding disorders: none            Past Medical History:   Diagnosis Date    Bladder cancer (CMS code)     Depression with anxiety     Osteoarthritis        Past Surgical History:   Procedure Laterality Date    REPLACEMENT TOTAL KNEE Left 12/2014    Dr Purcell Mouton at [facility]  01/2017    Bladder cancer                                                                                                                                                                                                      Allergies/Contraindications  No Known Allergies    Vitals:    02/15/20 1036 02/15/20 1038   BP: (!) 110/95 131/70   BP Location: Left upper arm Right upper arm   Patient Position: Sitting Sitting   Cuff Size: Adult Adult   Pulse: 93 92   Weight: 80.2 kg (176 lb 12.8 oz)        PHYSICAL EXAM  General: Alert.  Appears stated age.  Cooperative and conversant.  No acute distress.  Head: Normocephalic, without obvious abnormality.  Atraumatic.  Eyes: Anicteric sclera.Lids & lashes normal, normal conjunctiva.   Lungs: Normal respiratory effort, speaking complete sentences.  No wheezes, rales, or rhonchi in bilateral lung fields.  Heart: Regular rate and rhythm.  Normal S1 and S2.  No murmurs, rubs, clicks, or gallops.    ASSESSMENT AND PLAN    Gina Spence has been medically evaluated for the indicated procedure. There is no  contraindication to this procedure; recommended preop screening has been ordered; results will be addressed with the patient and this information will  be forwarded to the surgical speciality office.       There are no diagnoses linked to this encounter.               Relevant Orders    Complete Blood Count with Differential    Comprehensive Metabolic Panel (BMP, AST, ALT, T.BILI, ALKP, TP, ALB)    Prothrombin Time    Urinalysis with Microscopy    Prealbumin    Preventative health care - Primary     HPI  Last colonoscopy was in 2014  Recent mammogram outside of [place]  No longer smoking, quit 20 years ago.    ASSESSMENT AND PLAN  Ordered A1c, CMP, CBC, Hepatitis C, albumin panels.  colonoscopy  next year.  Ordered DEXA scan.         Relevant Orders    Complete Blood Count with Differential    Comprehensive Metabolic Panel (BMP, AST, ALT, T.BILI, ALKP, TP, ALB)    Prealbumin    Hemoglobin A1c    DEXA Bone Density Spine/Hip    Hepatitis C Antibody      Other Visit Diagnoses     Asymptomatic menopausal state        Relevant Orders    DEXA Bone Density Spine/Hip         I, Avni Joshi am acting as a scribe for services provided by Quitman Livings, MD on 02/15/2020 10:37 AM  The above scribed documentation accurately reflects the services I have provided.    Quitman Livings, MD   02/16/2020 11:32 AM

## 2020-02-15 NOTE — Assessment & Plan Note (Signed)
HPI  Had previous bladder infection with urinary frequency.    ASSESSMENT AND PLAN  Ordered urinalysis.  Please let me know if symptoms persist, worsen, or if any new symptoms arise.

## 2020-02-15 NOTE — Patient Instructions (Signed)
It was very nice seeing you in clinic today.    I have shared my note with you.     Problem List Items Addressed This Visit                                           Relevant Orders    Complete Blood Count with Differential (Completed)    Comprehensive Metabolic Panel (BMP, AST, ALT, T.BILI, ALKP, TP, ALB) (Completed)    Prothrombin Time (Completed)    Urinalysis with Microscopy (Completed)    Prealbumin    Preventative health care - Primary     HPI  Last colonoscopy was in 2014  Recent mammogram outside of [place]  No longer smoking, quit 20 years ago.    ASSESSMENT AND PLAN  Ordered A1c, CMP, CBC, Hepatitis C, albumin panels.  colonoscopy for next year.  Ordered DEXA scan.             Relevant Orders    Complete Blood Count with Differential (Completed)    Comprehensive Metabolic Panel (BMP, AST, ALT, T.BILI, ALKP, TP, ALB) (Completed)    Prealbumin    Hemoglobin A1c (Completed)    DEXA Bone Density Spine/Hip    Hepatitis C Antibody      Other Visit Diagnoses     Asymptomatic menopausal state        Relevant Orders    DEXA Bone Density Spine/Hip         You can always email me on my chart or call my office if you have any questions after your visit    This note was created using Dragon voice recognition software. Some errors in proofreading can occur. Please contact me if there are errors or changes need to be modified or if there are any questions.

## 2020-02-15 NOTE — Assessment & Plan Note (Signed)
HPI  Last colonoscopy was in 2014  Recent mammogram outside of [place]  No longer smoking, quit 20 years ago.    ASSESSMENT AND PLAN  Ordered A1c, CMP, CBC, Hepatitis C, albumin panels.  Ordered colonoscopy for next year.  Ordered DEXA scan.  Will order CT lung cancer screening in the future.

## 2020-02-16 LAB — ECG 12-LEAD
Atrial Rate: 79 {beats}/min
Calculated P Axis: 60 degrees
Calculated R Axis: 9 degrees
Calculated T Axis: 67 degrees
P-R Interval: 128 ms
QRS Duration: 78 ms
QT Interval: 338 ms
QTcb: 387 ms
Ventricular Rate: 79 {beats}/min

## 2020-02-16 LAB — PREALBUMIN: Prealbumin: 27 mg/dL (ref 20–37)

## 2020-02-16 LAB — HEMOGLOBIN A1C: Hemoglobin A1c: 5.8 PERCENT — ABNORMAL HIGH (ref 4.3–5.6)

## 2020-02-16 NOTE — Telephone Encounter (Signed)
-----   Message from Quitman Livings, MD sent at 02/16/2020 11:35 AM PST -----  Please send my notes , labs , ekg to surgeon for pre op clearance     Thanks,  Jon Billings

## 2020-02-16 NOTE — Telephone Encounter (Signed)
Faxed pre op paper work

## 2020-03-10 ENCOUNTER — Ambulatory Visit: Admit: 2020-03-10 | Discharge: 2020-03-10 | Payer: MEDICARE | Attending: Physician | Primary: Physician

## 2020-03-10 DIAGNOSIS — R0602 Shortness of breath: Secondary | ICD-10-CM

## 2020-03-10 NOTE — Addendum Note (Signed)
Addended by: Vedia Pereyra on: 03/13/2020 12:02 PM     Modules accepted: Orders

## 2020-03-10 NOTE — Progress Notes (Signed)
Gina Spence is a very nice 65 y.o. female patient here with a chief complaint of Lab Results    Problem based HPI and assessment and plan      Problem List Items Addressed This Visit        Respiratory    Shortness of breath - Primary     HPI  Dyspnea: Patient complains of having labored breathing since 6 months , has to take a few deep breaths and breathing improves after that . Mouth breathing at night , sometimes wheezing , on and off cough   no frequent throat clearing, post nasal drip and tightness in chest. Symptoms began several months ago, stable since that time.     Patient does not have had recent travel.  Weight has been stable.     Smoked for 20 yrs 1 ppd and stopped 20 yrs ago   Knee surgery 2 weeks ago but symptoms from before then  No calf redness , swelling     ASSESSMENT AND PLAN  We will need to get lung function work up and imaging - please let me know where you would like me to send the orders in  Please let me know where you would like me to send the orders to a facility close to home  return precautions discussed  please let me know if symptoms persist or worsen or any new symptoms                Endocrine/Metabolic    Prediabetes      Other Visit Diagnoses     Cough        Dyspnea, unspecified type        Relevant Orders    [facility] Adult Pulmonary Function Test Full PFT    B-Type Natriuretic Peptide    Fibrin D-Dimers    CT Chest with Contrast                   A complete Review of systems was otherwise negative    reviewed records    Physical exam  General: Appears stated age.    No acute distress.           This note was created using Systems analyst. Some errors in proofreading can occur. Please contact me if there are errors or changes need to be modified or if there are any questions.  I spent a total of 30 minutes on this patient's care on the day of their visit excluding time spent related to any billed procedures. This time includes time spent with the patient  as well as time spent documenting in the medical record, reviewing patient's records and tests, obtaining history, placing orders, communicating with other healthcare professionals, counseling the patient, family, or caregiver, and/or care coordination for the diagnoses above.    I performed this consultation using real-time Telehealth tools, including a live video connection between my location and the patient's location.

## 2020-03-10 NOTE — Addendum Note (Signed)
Addended by: Vedia Pereyra on: 03/14/2020 09:21 AM     Modules accepted: Orders

## 2020-03-10 NOTE — Assessment & Plan Note (Addendum)
HPI  Dyspnea: Patient complains of having labored breathing since 6 months , has to take a few deep breaths and breathing improves after that . Mouth breathing at night , sometimes wheezing , on and off cough   no frequent throat clearing, post nasal drip and tightness in chest. Symptoms began several months ago, stable since that time.     Patient does not have had recent travel.  Weight has been stable.     Smoked for 20 yrs 1 ppd and stopped 20 yrs ago   Knee surgery 2 weeks ago but symptoms from before then  No calf redness , swelling     ASSESSMENT AND PLAN  We will need to get lung function work up and imaging - please let me know where you would like me to send the orders in  Please let me know where you would like me to send the orders to a facility close to home  return precautions discussed  please let me know if symptoms persist or worsen or any new symptoms

## 2020-03-10 NOTE — Patient Instructions (Addendum)
It was very nice seeing you in clinic today.    I have shared my note with you.     Problem List Items Addressed This Visit        Respiratory    Shortness of breath - Primary     ASSESSMENT AND PLAN  We will need to get lung function work up and imaging - please let me know where you would like me to send the orders in  Please let me know where you would like me to send the orders to a facility close to home  return precautions discussed  please let me know if symptoms persist or worsen or any new symptoms                Endocrine/Metabolic    Prediabetes      Other Visit Diagnoses     Cough        Dyspnea, unspecified type        Relevant Orders    [facility] Adult Pulmonary Function Test Full PFT    B-Type Natriuretic Peptide    Fibrin D-Dimers    CT Chest with Contrast         You can always email me on my chart or call my office if you have any questions after your visit    This note was created using Dragon voice recognition software. Some errors in proofreading can occur. Please contact me if there are errors or changes need to be modified or if there are any questions.

## 2020-03-15 ENCOUNTER — Inpatient Hospital Stay: Admit: 2020-03-15 | Payer: MEDICARE | Primary: Physician

## 2020-03-15 DIAGNOSIS — R0600 Dyspnea, unspecified: Secondary | ICD-10-CM

## 2020-03-15 MED ORDER — IOHEXOL 350 MG IODINE/ML INTRAVENOUS SOLUTION
350 | Freq: Once | INTRAVENOUS | Status: AC
Start: 2020-03-15 — End: 2020-03-15
  Administered 2020-03-15: 20:00:00 120 mL via INTRAVENOUS

## 2020-03-16 LAB — B-TYPE NATRIURETIC PEPTIDE: BNP: 34 pg/mL (ref <100)

## 2020-03-16 LAB — FIBRIN D-DIMERS: D-Dimer Quantitative: 4.15 mcg/mL FEU — ABNORMAL HIGH (ref <0.50)

## 2020-03-16 NOTE — Telephone Encounter (Signed)
Patient called requesting Dr. Al Pimple get back to her asap.  Please advise

## 2020-03-17 ENCOUNTER — Ambulatory Visit: Admit: 2020-03-21 | Payer: MEDICARE | Attending: Physician | Primary: Physician

## 2020-03-17 DIAGNOSIS — R918 Other nonspecific abnormal finding of lung field: Secondary | ICD-10-CM

## 2020-03-17 NOTE — E-Consult Note (Signed)
I am requesting an eConsult for this 65 y.o. woman to Pulmonary Medicine. None of the specific referral SmartTexts apply to this problem.   My clinical question: Gina Spence is a 65 y.o. female who complains of having labored breathing since 6 months , has to take a few deep breaths and breathing improves after that . Mouth breathing at night , sometimes wheezing , on and off cough   no frequent throat clearing, post nasal drip and tightness in chest. Symptoms began several months ago, stable since that time.     Patient does not have had recent travel.  Weight has been stable.     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 The radiologist has recommended pulmonary consultation  to determine the next best step in management. Would you recommend that I order a biopsy for this patient  also she has a slightly elevated d-dimer which might be due to underlying malignancy but No calf redness , swelling . Would you recommend getting a CT angiogram or VQ scan to rule out a pulmonary embolism at this time  Thank you very much for your expert evaluation and recommendations.    SPECIALIST'S E-CONSULT RESPONSE      Thank you for this eConsult. In this case, we should schedule the patient for an in-person or video visit. We will contact the patient for scheduling.     STAFF: Please book patient within next available slot, not urgent if possible, and call her to explain that her PCP placed an eConsult; however we think it is appropriate to evaluate her in our clinic for by video visit. (Change the priority field from eConsult to Routine and this eConsult will appear as a standard referral in the workqueue).    ECONSULTANT: Please remember to cc:  your assigned staff or pool in Send Chart.    Ave Filter, MD

## 2020-03-17 NOTE — Progress Notes (Signed)
Gina Spence is a very nice 65 y.o. female patient here with a chief complaint of Results    Problem based HPI and assessment and plan      Problem List Items Addressed This Visit        Respiratory    Pulmonary nodules - Primary     Please schedule appointment with pulmonology         Relevant Orders    eConsult to Pulmonary (from Primary Care)    Ambulatory referral to Pulmonology       Hematologic    Positive D dimer     No calf swelling redness or worsening shortness of breath    ASSESSMENT AND PLAN  We discussed options and decided to get a CT angiogram         Relevant Orders    eConsult to Pulmonary (from Primary Care)                           This note was created using Dragon voice recognition software. Some errors in proofreading can occur. Please contact me if there are errors or changes need to be modified or if there are any questions.  I performed this consultation using real-time Telehealth tools, including a telephone call as patient was unable to establish a live video connection between my location and the patient's location. Prior to initiating the consultation, I obtained informed verbal consent to perform this consultation using Telehealth tools and answered all the questions about the Telehealth interaction.

## 2020-03-17 NOTE — Patient Instructions (Addendum)
Please call [phone] to schedule an appointment with radiology    It was very nice seeing you in clinic today.    I have shared my note with you.     Problem List Items Addressed This Visit        Respiratory    Pulmonary nodules - Primary     Please schedule appointment with pulmonology         Relevant Orders    eConsult to Pulmonary (from Primary Care)    Ambulatory referral to Pulmonology       Hematologic    Positive D dimer     CT angiogram is reassuringly negative for pulmonary embolism          Relevant Orders    eConsult to Pulmonary (from Primary Care)         You can always email me on my chart or call my office if you have any questions after your visit    This note was created using Dragon voice recognition software. Some errors in proofreading can occur. Please contact me if there are errors or changes need to be modified or if there are any questions.

## 2020-03-21 ENCOUNTER — Inpatient Hospital Stay: Admit: 2020-03-21 | Payer: MEDICARE | Primary: Physician

## 2020-03-21 DIAGNOSIS — R7989 Other specified abnormal findings of blood chemistry: Secondary | ICD-10-CM

## 2020-03-21 MED ORDER — IOHEXOL 350 MG IODINE/ML INTRAVENOUS SOLUTION
350 | Freq: Once | INTRAVENOUS | Status: AC
Start: 2020-03-21 — End: 2020-03-21
  Administered 2020-03-21: 19:00:00 53 mL via INTRAVENOUS

## 2020-03-21 NOTE — Assessment & Plan Note (Signed)
No calf swelling redness or worsening shortness of breath    ASSESSMENT AND PLAN  We discussed options and decided to get a CT angiogram

## 2020-03-21 NOTE — Assessment & Plan Note (Addendum)
Please schedule appointment with pulmonology

## 2020-03-23 ENCOUNTER — Ambulatory Visit: Admit: 2020-03-23 | Discharge: 2020-03-23 | Payer: MEDICARE | Attending: Physician | Primary: Physician

## 2020-03-23 DIAGNOSIS — F410 Panic disorder [episodic paroxysmal anxiety] without agoraphobia: Secondary | ICD-10-CM

## 2020-03-23 MED ORDER — LORAZEPAM 0.5 MG TABLET
0.5 | ORAL_TABLET | Freq: Three times a day (TID) | ORAL | 0 refills | 10.00000 days | Status: DC | PRN
Start: 2020-03-23 — End: 2020-04-24

## 2020-03-23 MED ORDER — MIRTAZAPINE 15 MG TABLET
15 mg | ORAL_TABLET | Freq: Every day | ORAL | 1 refills | Status: DC
Start: 2020-03-23 — End: 2020-04-24

## 2020-03-23 MED ORDER — NALOXONE 4 MG/ACTUATION NASAL SPRAY
4 | Freq: Once | NASAL | 0 refills | Status: DC | PRN
Start: 2020-03-23 — End: 2020-04-24

## 2020-03-23 NOTE — Assessment & Plan Note (Signed)
HPI  Pulmonology appointment scheduled for 04/12/20.  Patient does not want to do chemotherapy or radiation.    ASSESSMENT AND PLAN  Will have my office call Pulmonology for an earlier appointment.

## 2020-03-23 NOTE — Assessment & Plan Note (Addendum)
HPI   panic attacks dt stress around recent findings   Requesting Lorazepam for panic attacks   Stopped taking trazadone after using intermittently for 15 years, no longer working.  Tried Ambien about 20 years ago.    ASSESSMENT AND PLAN  Prescribed mirtazapine 7.5 mg daily at bedtime    discussed side effect   Prescribed Ativan for panic attacks.  Please let me know if symptoms persist, worsen, or if any new symptoms arise.  Please let me know if you experience any side effects.

## 2020-03-23 NOTE — Assessment & Plan Note (Signed)
HPI  Staying in [place] temporarily due to recent knee surgery and nodules.  Planning on going to [place] soon.

## 2020-03-23 NOTE — Progress Notes (Signed)
Gina Spence is a 65 y.o. female patient here with a chief complaint of Follow-up    Problem based HPI and assessment and plan      Problem List Items Addressed This Visit        Respiratory    Pulmonary nodules     HPI  Pulmonology appointment scheduled for 04/12/20.  Patient prefers not to do chemotherapy or radiation.  Would like to get diagnosis early so she can try natural remedies     ASSESSMENT AND PLAN  Will have my office call Pulmonology for an earlier appointment.                        Panic attacks - Primary     HPI   panic attacks dt stress around recent findings   Requesting Lorazepam for panic attacks   Stopped taking trazadone after using intermittently for 15 years, no longer working.  Tried Ambien about 20 years ago.    ASSESSMENT AND PLAN  Prescribed mirtazapine 7.5 mg daily at bedtime    discussed side effect   Prescribed Ativan for panic attacks.  Please let me know if symptoms persist, worsen, or if any new symptoms arise.  Please let me know if you experience any side effects.         Relevant Medications    mirtazapine (REMERON) 15 mg tablet    LORazepam (ATIVAN) 0.5 mg tablet    naloxone 4 mg/actuation SPRAYNAERO      Other Visit Diagnoses     Insomnia due to stress        Relevant Medications    mirtazapine (REMERON) 15 mg tablet         Physical Exam   General: Patient appearing well, alert.    I performed this evaluation using real-time telehealth tools, including a live video Zoom connection between my location and the patient's location. Prior to initiating, the patient consented to perform this evaluation using telehealth tools. My location is in a [place] clinical facility.    I, Barkley Boards am acting as a Education administrator for services provided by Quitman Livings, MD on 03/23/2020 1:44 PM  The above scribed documentation accurately reflects the services I have provided.    Quitman Livings, MD   03/23/2020 7:35 PM

## 2020-03-23 NOTE — Patient Instructions (Signed)
It was very nice seeing you in clinic today.    I have shared my note with you.     Problem List Items Addressed This Visit        Respiratory    Pulmonary nodules     Will have my office call Pulmonology to see if we can get an earlier appointment.                        Panic attacks - Primary     Prescribed mirtazapine 7.5 mg daily at bedtime   Prescribed Ativan for panic attacks.  Please let me know if symptoms persist, worsen, or if any new symptoms arise.  Please let me know if you experience any side effects.         Relevant Medications    mirtazapine (REMERON) 15 mg tablet    LORazepam (ATIVAN) 0.5 mg tablet    naloxone 4 mg/actuation SPRAYNAERO      Other Visit Diagnoses     Insomnia due to stress        Relevant Medications    mirtazapine (REMERON) 15 mg tablet         You can always email me on my chart or call my office if you have any questions after your visit    This note was created using Dragon voice recognition software. Some errors in proofreading can occur. Please contact me if there are errors or changes need to be modified or if there are any questions.

## 2020-03-28 NOTE — Telephone Encounter (Signed)
Spoke to [employer] @Pulmonary , pt is going to be seen by Dr.Gesthalter who specialized in pt's dx. Called and spoke to pt and made her aware of this. Pt understood, pt also mentioned that she is on a wait list for a sooner appt.

## 2020-04-02 NOTE — Telephone Encounter (Signed)
Patient contacted by RN or has completed COVID-19 self-assessment online and screened negative on questionnaires. COVID testing for PCR and ambulatory referral to COVID testing appointment placed.     To be tested 3-4 days prior to procedure on 3/5 or 3/6 at [place]. [facility].      Self ticketing available on 04/02/2020 or 04/03/20

## 2020-04-03 NOTE — Addendum Note (Signed)
Addended by: Loman Brooklyn on: 04/03/2020 10:35 AM     Modules accepted: Orders

## 2020-04-03 NOTE — Telephone Encounter (Signed)
[place],    Pt. Gina Spence has been scheduled for covid testing at [facility] on 3/7 at 3:10pm prior to her PFT on 3/9. Can you update order to 2 day please?    Thank you

## 2020-04-03 NOTE — Addendum Note (Signed)
Addended by: Loman Brooklyn on: 04/03/2020 01:17 PM     Modules accepted: Orders

## 2020-04-03 NOTE — Telephone Encounter (Signed)
Called 1x lvm

## 2020-04-10 ENCOUNTER — Ambulatory Visit: Admit: 2020-04-10 | Discharge: 2020-04-10 | Payer: MEDICARE | Primary: Physician

## 2020-04-10 DIAGNOSIS — Z1159 Encounter for screening for other viral diseases: Secondary | ICD-10-CM

## 2020-04-11 LAB — COVID-19 RNA, RT-PCR/NUCLEIC A: COVID-19 RNA, RT-PCR/Nucleic A: NOT DETECTED

## 2020-04-12 ENCOUNTER — Inpatient Hospital Stay: Admit: 2020-04-12 | Payer: MEDICARE | Primary: Physician

## 2020-04-12 DIAGNOSIS — R0600 Dyspnea, unspecified: Secondary | ICD-10-CM

## 2020-04-12 DIAGNOSIS — R918 Other nonspecific abnormal finding of lung field: Secondary | ICD-10-CM

## 2020-04-13 ENCOUNTER — Ambulatory Visit: Admit: 2020-04-13 | Payer: MEDICARE | Attending: Physician | Primary: Physician

## 2020-04-13 DIAGNOSIS — R918 Other nonspecific abnormal finding of lung field: Secondary | ICD-10-CM

## 2020-04-13 MED ORDER — MEDICAL CANNABIS: Status: DC

## 2020-04-13 NOTE — Progress Notes (Signed)
[facility]  Interventional Pulmonary Medicine   [address] [place] [address]  Tel - [phone]     Patient Identifiers:  Gina Spence   DOB: [birth year]   MRN:  [id number]     Date of Service: 04/13/2020     Initial visit    Referring provider: Quitman Livings, Md  [place] [address]    Thank you for referring Sofiah Lyne to the [facility] Interventional Pulmonary clinic.    Reason for referral: Multiple mixed solid, groundglass, and cystic nodules    History :  Gina Spence is a 65 y.o. female who  has a past medical history of Bladder cancer (CMS code), Depression with anxiety, and Osteoarthritis. who is being seen in consultation in the [facility] for evaluation of pulmonary nodules.    Gina Spence is a 65 y.o. female with a history of bladder cancer and osteoarthritis who initially noted labored breathing approximately 6 months ago. She states that the sensation is a chest tightness while breathing. She underwent a CT Chest to work up her labored breathing which noted emphysema and multiple mixed solid/GGO nodules for which she was referred to Interventional Pulmonology.    Aside from the chest tightness, she is fairly asymptomatic from the respiratory standpoint.  She denies shortness of breath, coughing, wheezing or hemoptysis. She is able to walk 2 miles per day and up a flight of stairs without issue. She has noticed that she is mouth breathing more frequently at night.    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 (Treatments may have been administered in another system.)        Past Medical History:   Diagnosis Date    Bladder cancer (CMS code)      Depression with anxiety     Osteoarthritis       Past Surgical History:   Procedure Laterality Date    REPLACEMENT TOTAL KNEE Left 12/2014    Dr Purcell Mouton at [place]  01/2017    Bladder cancer     Social History     Socioeconomic History    Marital status: Divorced     Spouse name: Not on file    Number of children: Not on file    Years of education: Not on file    Highest education level: Not on file   Occupational History    Not on file   Tobacco Use    Smoking status: Never Smoker    Smokeless tobacco: Never Used   Substance and Sexual Activity    Alcohol use: Yes     Alcohol/week: 1.0 - 2.0 standard drink     Types: 1 - 2 Glasses of wine per week     Comment: A glass of wine every night     Drug use: Yes     Types: Marijuana     Comment: Once a day at bedtime.     Sexual activity: Not on file   Other Topics Concern    Not on file   Social History Narrative    From [place]. Moved to [facility]  in 1992.    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     Shares house with 3 tenants; she is on the top floor.     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 [place]    "I'm a hippy"; interested in "natural healing without side effects"     Social Determinants of Health     Financial Resource Strain:     Difficulty of Paying Living Expenses: Not on file   Food Insecurity:     Worried About Charity fundraiser in the Last Year: Not on file    [employer] of Food in the Last Year: Not on file   Transportation Needs:     Lack of Transportation (Medical): Not on file    Lack of Transportation (Non-Medical): Not on file     Family History   Problem Relation Name Age of Onset    Lung cancer Mother      Depression Mother      Alcohol abuse Mother      Liver cancer Father      Alcohol abuse Father        Current Outpatient Medications   Medication Sig    DULoxetine (CYMBALTA) 30 mg DR capsule      HYDROcodone-acetaminophen (NORCO) 5-325 mg tablet Take 1-2 tablets every 4-6 hours as needed for pain    LORazepam (ATIVAN) 0.5 mg tablet Take 1 tablet (0.5 mg total) by mouth every 8 (eight) hours as needed for Anxiety    LORazepam (ATIVAN) 1 mg tablet Take 1 tablet (1 mg total) by mouth daily as needed for Anxiety    medical cannabis by Other route    mirtazapine (REMERON) 15 mg tablet Take 0.5 tablets (7.5 mg total) by mouth nightly at bedtime    naloxone 4 mg/actuation SPRAYNAERO 1 spray by Nasal route once as needed (suspected overdose) for up to 1 dose Call 911. Repeat if needed     Current Facility-Administered Medications   Medication    sodium hyaluronate (EUFLEXXA) 10 mg/mL(mw 2.4 -3.6 million) injection syringe 20 mg     Allergies/Contraindications  No Known Allergies    I have reviewed the past medical history, family and social history, allergies and current medication    Review of Systems   Respiratory:        Chest tightness   All other systems reviewed and are negative.    The remainder of systems were reviewed and were negative.    Vitals:    04/13/20 1557   BP: (!) 141/74   Pulse: 92   Resp: 16   Temp: (!) 35.9 C (96.6 F)   TempSrc: Temporal   SpO2: 96%   Weight: 77.5 kg (170 lb 14.4 oz)   Height: 185.4 cm (6\' 1" )       Physical Exam:  Constitutional:  NAD  HEENT: NCAT, PERRL, Sclera white, Trachea midline  Neuro: A/Ox3, MAE, no focal deficits  Heart:  S1S2, RRR,   Lungs:  CTAB, no wheese/rales/rhonchi  Extremities: Warm, well perfused, pulses intact,  no LE edema  Skin: Skin grossly intact  Vitals Reviewed.    Data     Laboratory:   Lab Results   Component Value Date    WBC Count 8.3 02/15/2020    Hemoglobin 15.3 02/15/2020    Hematocrit 47.3 (H) 02/15/2020    MCV 92 02/15/2020    Platelet  Count 271 02/15/2020    Sodium, Serum / Plasma 138 02/15/2020    Potassium, Serum / Plasma 4.7 02/15/2020    Chloride, Serum / Plasma 101 02/15/2020    Carbon Dioxide, Total 25 02/15/2020    Urea  Nitrogen, Serum / Plasma 19 02/15/2020    Creatinine 0.59 02/15/2020    Glucose, non-fasting 78 02/15/2020    Alanine transaminase 21 02/15/2020    AST 20 02/15/2020    Alkaline Phosphatase 66 02/15/2020    Bilirubin, Total 0.4 02/15/2020     Report from most recent studies are as follows:  CT Chest with Contrast    Result Date: 03/15/2020  CT CHEST WITH CONTRAST CLINICAL HISTORY:  Dyspnea COMPARISON: None TECHNIQUE: Serial 1.25 mm axial images through the chest were obtained after the administration of intravenous contrast. RADIATION DOSE INDICATORS: Exposure Events: 3 , CTDIvol Min: 5 mGy, CTDIvol Max: 5 mGy, DLP: 193.9 mGy.cm FINDINGS: LUNGS: Mild centrilobular and paraseptal emphysema. There are multiple bilateral groundglass and mixed solid and groundglass pulmonary nodules predominantly in the upper and mid lungs. Several of these are also associated with groups of clustered cysts. A representative groundglass nodule in the right upper lobe measures 1.9 x 1.5 cm on series 2 image 100. A representative mixed cystic, solid, and groundglass nodule in the right upper lobe abutting the fissure measures 1.6 x 1.1 cm. A representative mixed cystic and groundglass nodule in the superior segment of the left lower lobe measures 3.1 x 2.1 cm on series 2 image 91. A representative cystic and groundglass nodule in the right upper lobe measures 1.4 cm on series 2 image 80. Small juxtapleural nodules measuring up to 4 mm in the right lower lobe (series 2 image 155) most likely reflect intrapulmonary lymph nodes. PLEURA: The pleura is normal. MEDIASTINUM: Mild calcified atherosclerosis of the thoracic aorta. Mild aortic leaflet calcification. 1.1 cm right thyroid nodule. HEART/GREAT VESSELS: Normal heart size. BONES/SOFT TISSUES: No suspicious bone lesions in the chest. VISIBLE ABDOMEN: Mild thickening of the left adrenal gland without discrete nodule.     1. 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 A 3 month follow-up CT should be performed to confirm persistence, and pulmonary consultation is recommended to determine the next best step in management. //ALERT// Report dictated by: Merlene Laughter, MD, signed by: Merlene Laughter, MD Department of Radiology and Biomedical Imaging    CT Chest Pulmonary Embolism (CTPE)    Result Date: 03/21/2020  CT CHEST PULMONARY EMBOLISM (CTPE) CLINICAL HISTORY:  Pulmonary embolism suspected COMPARISON: CT chest from 03/15/2020 TECHNIQUE: Serial 1.25 mm axial images through the chest were obtained after the administration of intravenous contrast using a pulmonary embolism protocol. Iohexol 350 - 53 mL - Intravenous RADIATION DOSE INDICATORS: Exposure Events: 4 , CTDIvol Min: 6.8 mGy, CTDIvol Max: 14.4 mGy, DLP: 262 mGy.cm The attending physician certifies the medical necessity of the chest CT obtained in this patient according to our "pulmonary embolism" protocol, and its processing. Chest CT and its processing (creation of maximal intensity projections on the CT scanner console) were required in this patient because they: -provide accurate assessment of pulmonary arteries -can confirm or rule out the presence of central, segmental and/or subsegmental pulmonary embolism -helps differentiating pulmonary embolism form other mimicking entities such as adjacent veins or lymphadenopathy FINDINGS: PULMONARY ARTERIES: No pulmonary embolism through the subsegmental pulmonary arteries. LUNGS: Mild centrilobular and paraseptal emphysema. There are multiple bilateral mixed solid and  groundglass pulmonary nodules associated with groups of clustered cysts predominantly in the upper and mid lungs, all unchanged compared to prior. PLEURA: No pleural effusions. MEDIASTINUM: No lymphadenopathy. HEART/GREAT VESSELS: Normal heart size. BONES/SOFT TISSUES: No suspicious bone lesions. Redemonstrated multinodular  thyroid with right thyroid nodule measuring up to 1.1 cm. VISIBLE ABDOMEN: Mild thickening of the left adrenal gland without discrete nodule, unchanged compared to prior.     1.  No pulmonary embolism. 2.  No change in multiple bilateral mixed solid and groundglass pulmonary nodules associated with groups of clustered cysts predominantly in the upper and mid lungs, likely representing adenocarcinoma spectrum lesions. Recommend follow-up CT in 3 months. 3.  No findings in the chest to explain patient's worsening dyspnea. Report dictated by: Ilda Foil, MD, signed by: Tomi Likens, MD Department of Radiology and Biomedical Imaging    I have personally reviewed the following studies and interpret them as indicated:    CHEST CT SCAN from 03/14/2020 which I interpret to demonstrate multiple, scattered mixed solid/GGO opacities in the RUL and superior segment of the LLL (largest, ~ 3x2cm).    PFT        Some values may be hidden. Unless noted otherwise, only the newest values recorded on each date are displayed.         PFT (Internal and External) Latest Ref Range 04/12/20   FVC 4.09 L 4.45   FVC PRED % 109   FEV1 3.13 L 2.70   FEV1 PRED % 86   FEV1/FVC 76.57 % 60.53   DCO, HGB COR 29.92 mL/min/mmHg 20.51   DCO, HGB COR %PRED % 69           Assesment and Plan   Gina Spence is a 65 y.o. female with a history of bladder cancer, 20 pack year smoking history and osteoarthritis who was found to have multiple pulmonary nodules.    These nodules are concerning for possible lepidic adenocarcinoma. However, we only have one CT chest and are, therefore, unable to know if these nodules are changing in size or morphology. Current recommendation is to follow up with a CT Chest in 3 months. We discussed this option of surveillance imaging as well as other possible future measures (biopsy, referral to oncology/radiation oncology/thoracic surgery). Ms. Ildefonso asked appropriate and insightful questions and they were answered to her  satisfaction.    We will plan to have her follow up in clinic in 3 months after non-contrast CT Chest.    Vivien Rota, NP  Interventional Pulmonology    This is a shared service.     I spent a total of 72 minutes on this patient's care on the day of their visit excluding time spent related to any billed procedures. This time includes time spent with the patient as well as time spent documenting in the medical record, reviewing patient's records and tests, obtaining history, placing orders, communicating with other healthcare professionals, counseling the patient, family, or caregiver, and/or care coordination for the diagnoses above.

## 2020-04-17 LAB — PULMONARY FUNCTION TEST REPORT
AGE: 67 yr
BMI: 23.2 kg/m2 (ref 18.5–24.9)
DCO, HGB COR %PRED: 69 %
DCO, HGB COR / TLC %PRED: 59 %
DCO, HGB COR / TLC: 3 mL/min/mmHg/L
DCO, HGB COR: 20.51 mL/min/mmHg
DCO, HGB UNCOR %PRED: 69 %
DCO, HGB UNCOR / TLC %PRED: 59 %
DCO, HGB UNCOR / TLC: 3 mL/min/mmHg/L
DCO, HGB UNCOR: 20.51 mL/min/mmHg
FEF 25%  %PRED: 63 %
FEF 25%: 3.62 L/s
FEF 25-75%  %PRED: 49 %
FEF 25-75%: 1.22 L/s
FEF 50%  %PRED: 44 %
FEF 50%: 1.73 L/s
FEF 75%  %PRED: 28 %
FEF 75%: 0.37 L/s
FEV1 %PRED: 86 %
FEV1 / FVC %PRED: 79 %
FEV1 / FVC: 60.53 %
FEV1: 2.7 L
FVC %PRED: 109 %
FVC: 4.45 L
HEIGHT: 182 cm
Hemoglobin: 13.4 gm/dL
PEAK FLOW %PRED: 100 %
PEAK FLOW: 7.1 L/s
TLC (SB) %PRED: 110 %
TLC (SB): 6.85 L
WEIGHT: 77 kg

## 2020-04-23 MED ORDER — LORAZEPAM 1 MG TABLET
1 | ORAL_TABLET | Freq: Every day | ORAL | 0 refills | Status: DC | PRN
Start: 2020-04-23 — End: 2020-04-26

## 2020-04-24 ENCOUNTER — Ambulatory Visit: Admit: 2020-04-24 | Discharge: 2020-04-24 | Payer: MEDICARE | Attending: Physician | Primary: Physician

## 2020-04-24 DIAGNOSIS — R942 Abnormal results of pulmonary function studies: Secondary | ICD-10-CM

## 2020-04-24 MED ORDER — ALBUTEROL SULFATE HFA 90 MCG/ACTUATION AEROSOL INHALER
90 mcg/actuation | RESPIRATORY_TRACT | 3 refills | Status: AC | PRN
Start: 2020-04-24 — End: 2020-05-10

## 2020-04-24 NOTE — Patient Instructions (Signed)
It was very nice seeing you in clinic today.    I have shared my note with you.     Problem List Items Addressed This Visit        Respiratory    Chronic obstructive pulmonary disease (CMS code)     I have placed a referral to pulmonology You should receive a call to schedule the appointment. If they don't give you a call in 2 weeks please get back to me .           Relevant Medications    albuterol 90 mcg/actuation metered dose inhaler                     Other Visit Diagnoses     Abnormal PFTs    -  Primary    Relevant Orders    Ambulatory referral to Pulmonology         You can always email me on my chart or call my office if you have any questions after your visit    This note was created using Dragon voice recognition software. Some errors in proofreading can occur. Please contact me if there are errors or changes need to be modified or if there are any questions.

## 2020-04-24 NOTE — Assessment & Plan Note (Signed)
Minimally symptomatic   Doesn't want to start any daily inhalers at this time   I have placed a referral to pulmonology You should receive a call to schedule the appointment. If they don't give you a call in 2 weeks please get back to me .

## 2020-04-24 NOTE — Assessment & Plan Note (Signed)
panic attacks dt stress around recent health issues  Seeing a therapist regularly   Requesting Lorazepam for panic attacks -taking 1mg  2-3 times a week  Doesn't want to start any daily meds at this time . We briefly discussed buspar , ssri

## 2020-04-24 NOTE — Progress Notes (Signed)
Gina Spence is a very nice 65 y.o. female patient here with a chief complaint of Results    Problem based HPI and assessment and plan      Problem List Items Addressed This Visit        Respiratory    Chronic obstructive pulmonary disease (CMS code)     Minimally symptomatic   Doesn't want to start any daily inhalers at this time   I have placed a referral to pulmonology You should receive a call to schedule the appointment. If they don't give you a call in 2 weeks please get back to me .           Relevant Medications    albuterol 90 mcg/actuation metered dose inhaler       Other    Panic attacks        panic attacks dt stress around recent health issues  Seeing a therapist regularly   Requesting Lorazepam for panic attacks -taking 1mg  2-3 times a week  Doesn't want to start any daily meds at this time . We briefly discussed buspar , ssri                Other Visit Diagnoses     Abnormal PFTs    -  Primary    Relevant Orders    Ambulatory referral to Pulmonology                         A complete Review of systems was otherwise negative    reviewed records    Physical exam  General: Appears stated age.   No acute distress.         This note was created using Systems analyst. Some errors in proofreading can occur. Please contact me if there are errors or changes need to be modified or if there are any questions.  I spent a total of 30 minutes on this patient's care on the day of their visit excluding time spent related to any billed procedures. This time includes time spent with the patient as well as time spent documenting in the medical record, reviewing patient's records and tests, obtaining history, placing orders, communicating with other healthcare professionals, counseling the patient, family, or caregiver, and/or care coordination for the diagnoses above.    I performed this consultation using real-time Telehealth tools, including a live video connection between my location and the  patient's location.

## 2020-04-26 MED ORDER — LORAZEPAM 1 MG TABLET
1 | ORAL_TABLET | Freq: Every day | ORAL | 0 refills | 10.00000 days | Status: DC | PRN
Start: 2020-04-26 — End: 2020-06-06

## 2020-06-07 MED ORDER — NALOXONE 1 MG/ML INJECTION SYRINGE
1 mg/mL | INTRAMUSCULAR | 1 refills | Status: AC
Start: 2020-06-07 — End: 2021-02-13

## 2020-06-07 MED ORDER — LORAZEPAM 1 MG TABLET
1 | ORAL_TABLET | Freq: Every day | ORAL | 0 refills | 10.00000 days | Status: AC | PRN
Start: 2020-06-07 — End: 2021-02-22

## 2020-06-12 ENCOUNTER — Inpatient Hospital Stay: Admit: 2020-06-12 | Payer: MEDICARE | Primary: Physician

## 2020-06-12 DIAGNOSIS — R918 Other nonspecific abnormal finding of lung field: Secondary | ICD-10-CM

## 2020-07-05 ENCOUNTER — Ambulatory Visit
Admit: 2020-07-08 | Discharge: 2020-08-03 | Payer: MEDICARE | Attending: Student in an Organized Health Care Education/Training Program | Primary: Physician

## 2020-07-05 DIAGNOSIS — R918 Other nonspecific abnormal finding of lung field: Secondary | ICD-10-CM

## 2020-07-05 DIAGNOSIS — J449 Chronic obstructive pulmonary disease, unspecified: Secondary | ICD-10-CM

## 2020-07-05 DIAGNOSIS — J310 Chronic rhinitis: Secondary | ICD-10-CM

## 2020-07-05 DIAGNOSIS — D7219 Other eosinophilia: Secondary | ICD-10-CM

## 2020-07-05 DIAGNOSIS — R942 Abnormal results of pulmonary function studies: Secondary | ICD-10-CM

## 2020-07-05 MED ORDER — B-COMPLEX WITH VITAMIN C TABLET
ORAL | 3.00 refills | 84.00000 days | Status: DC
Start: 2020-07-05 — End: 2020-08-02

## 2020-07-05 MED ORDER — PROBIOTIC ORAL
ORAL | Status: AC
Start: 2020-07-05 — End: ?

## 2020-07-05 MED ORDER — DIGESTIVE ENZYMES CAPSULE
ORAL | 0.00 refills | Status: DC
Start: 2020-07-05 — End: 2021-08-17

## 2020-07-05 NOTE — Patient Instructions (Addendum)
It was a pleasure seeing you today in the [facility]. We discussed several issues, as follows:     1) Continue exercise and to monitor your symptoms and if any flareup which does not improve, we can add inhalers as we discussed.    2) Next follow-up CT chest for monitoring of lung nodules is scheduled for Nov 2022, we will follow results. Please also continue to follow with Interventional Pulmonology.    3) We ordered sputum cultures and blood tests to evaluate for infection as per our discussion during the visit. Please schedule your appointment at your earliest convenience. Below is a list of scheduling contact numbers for your reference.       **Below is a list of scheduling contact numbers for certain departments only including our clinic. These numbers are for your reference in case you may need to schedule an appointment or have any questions.     Questions about scheduling? Please call the appropriate department:     - Radiology (imaging procedure): [phone]     - Pulmonary Function Testing: [phone]     - Echocardiography: [phone]     - Sleep Studies: [phone]     Lab Draws     - You can have your labs drawn at [place] (no appointment needed)          - [place]: [place], ground floor          - [place]: [address] ground floor     New results      - Please have them faxed to my office [phone].      Questions for the office staff      - Please call [phone]      - You can also message me and my staff using MyChartPatient Instructions/Patient Visit

## 2020-07-05 NOTE — Progress Notes (Signed)
This encounter was created in error - please disregard.

## 2020-07-05 NOTE — Addendum Note (Signed)
Addended by: Samuel Germany on: 08/02/2020 03:23 PM     Modules accepted: Orders

## 2020-07-05 NOTE — Progress Notes (Signed)
CC/Reason for Consult: female with COPD referral for pulmonary nodules    I performed this evaluation using real-time telehealth tools, including a live video Zoom connection between my location and the patient's location. Prior to initiating, the patient consented to perform this evaluation using telehealth tools.     Fellow MD: Filbert Schilder  Attending MD: Samuel Germany      Referring Provider: Vedia Pereyra    Subjective:   ID: Gina Spence is a 65 y.o. female with bladder cancer, depression/anxiety, COPD/emphysema, remote smoking history, and b/l ggos on CT, referred to Pulmonology Clinic for further evaluation      Interval History:   -3/10: Also referred to IP for review and consideration of biopsy for diagnosis  Seen in IP clinic on 04/16/20, with c/f multifocal lepedic growth adenoca with plan to repeat CT 3 mo and proceed with biopsy if any of the lesions progress.     Symptoms currently include 'labored breathing or tightness' approx 6 mo onset. This worsened after knee replacement surgery in Jan 2022. Feeling like would need to gasping for air, which self resolves with taking deep breaths. This has helped. Also uses essential oil which has helped along with 'big inhalations'. Issue breathing-in. Usually no cough but can have seasonal allergies to pollen, postnasal drip+, no gerd symptoms, no sinus pressure or congestion.     No medications doesn't like chemicals unless needed. Does not use any inhalers, but does not want to try because considers chemicals.     Exercise capacity currently walking 3 miles, walks slower takes about 1 hr, no resting needed. Becomes winded after 2 flights of stairs.     Shx: cigarette smoking 1 pack/d x 25 yrs, quit approx 24 yrs ago. Previously smoking marijuana but stopped early 2022.  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 Has a dog no allergies  Fhx: Mother lung ca (smoking history), Father liver ca (related to etoh  use)    Review of Systems   Constitutional: Negative for chills and fever.   HENT: Negative for congestion and sinus pain.    Eyes: Negative for discharge.   Respiratory: Negative for hemoptysis, wheezing and stridor.         Intermittent cough; labored breathing (see hpi)   Cardiovascular: Negative for chest pain and palpitations.   Gastrointestinal: Negative for heartburn, nausea and vomiting.   Musculoskeletal: Negative for joint pain.   Skin: Negative for rash.   Neurological: Negative for dizziness.   Endo/Heme/Allergies: Positive for environmental allergies.         Past Medical History:   Diagnosis Date    Bladder cancer (CMS code)     Depression with anxiety     Osteoarthritis         Past Surgical History:   Procedure Laterality Date    REPLACEMENT TOTAL KNEE Left 12/2014    Dr Purcell Mouton at [facility]  01/2017    Bladder cancer          Current Medications:    Current Outpatient Medications   Medication Sig Dispense Refill    DULoxetine (CYMBALTA) 30 mg DR capsule       HYDROcodone-acetaminophen (NORCO) 5-325 mg tablet Take 1-2 tablets every 4-6 hours as needed for pain 20 tablet 0    LORazepam (ATIVAN) 1 mg tablet Take 1 tablet (1 mg total) by mouth daily as needed for Anxiety 30 tablet 0    medical cannabis by  Other route      naloxone (NARCAN) 1 mg/mL pre-filled injection syringe Spray half of contents into each nostril for overdose. Call 911. Repeat if needed 4 mL 1     Current Facility-Administered Medications   Medication Dose Route Frequency Provider Last Rate Last Admin    sodium hyaluronate (EUFLEXXA) 10 mg/mL(mw 2.4 -3.6 million) injection syringe 20 mg  2 mL Intraarticular Q7 Days Alcan Border, PA-C   20 mg at 09/15/18 1622       Allergies/Contraindications  No Known Allergies    PHYSICAL EXAM:   There were no vitals filed for this visit.  Exam guided by me due to video format  There were no vitals taken for this visit.    Physical  Exam  Constitutional:       General: She is not in acute distress.  HENT:      Head: Normocephalic.      Nose: No rhinorrhea.      Mouth/Throat:      Mouth: Mucous membranes are moist.   Eyes:      General: No scleral icterus.        Right eye: No discharge.         Left eye: No discharge.      Conjunctiva/sclera: Conjunctivae normal.   Pulmonary:      Effort: Pulmonary effort is normal. No respiratory distress.   Musculoskeletal:      Cervical back: Normal range of motion.   Skin:     Coloration: Skin is not jaundiced.      Findings: No rash.   Neurological:      Mental Status: She is alert and oriented to person, place, and time.         DATA  I have personally reviewed the following studies and interpreted them as indicated:    Lab Results   Component Value Date    WBC Count 8.3 02/15/2020    Hemoglobin 15.3 02/15/2020    Hematocrit 47.3 (H) 02/15/2020    MCV 92 02/15/2020    Platelet Count 271 02/15/2020    INR 1.0 02/15/2020    PT 12.5 02/15/2020    Urea Nitrogen, Serum / Plasma 19 02/15/2020    Creatinine 0.59 02/15/2020   Abs eos 0.42    Micro/Other: none  03/15/20 BNP wnl  02/15/20 co2 25, Cl 101, Cr 0.59      PULMONARY FUNCTION TESTS  PFT Results (Internal & External) Latest Ref Rng & Units 04/12/2020   FVC (Parn) 4.09 L 4.45   FVC % Pred (Parn) % 109   FEV1 (Parn) 3.13 L 2.70   FEV1 % Pred (Parn) % 86   FEV1/FVC (Parn) 76.57 % 60.53   DLCO (Parn) 29.92 mL/min/mmHg 20.51   DLCO % Pred (Parn) % 69   from which I interpret to demonstrate FV curve curvilinear, decreased f/f, nml fev1, fvc, mildly reduced dlco. Mild obstruction, no ev/o restriction based on nml [place].     Radiology & Studies    CHEST CT SCAN   06/12/2020: from which I interpret to demonstrate gg's cystic nodule LLL 2.8cm, RUL 2cm nodule, 1.3cm RUL, no LAD, emphysematous changes.  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    03/21/2020: from which I interpreted to demonstrate multiple, scattered mixed solid/gg nodules, RUL.No change in multiple bilateral mixed solid and groundglass pulmonary nodules associated with groups of clustered cysts predominantly  in the upper and mid lungs, likely representing adenocarcinoma spectrum lesions.     03/15/20: Multiple mixed solid, groundglass, and cystic nodules which are most suspicious for multiple synchronous cystic pulmonary adenocarcinomas.    PET/CT SCAN none    TTE none    ASSESSMENT AND PLAN:    Gina Spence is a 65 y.o. female  with bladder cancer, depression/anxiety, COPD/emphysema, remote smoking history, and b/l ggos on CT, referred to Pulmonology Clinic for further evaluation      #COPD/emphysema  Suspect dyspnea and chest pressure which are the predominant symptoms are related to underlying mild obstructive lung disease, supported by decreased FEV1/FVC ratio on recent PFTs. Given smoking history, and mild impaired DLCO, COPD is likely the diagnosis. There is peripheral eos on most recent cbc which may be suggestive of Asthma overlap vs eosinophilic inflammation in COPD. Not currently using inhalers and the first step towards improved symptom management is optimization of her inhaler regimen. We have discussed initiation of inhaler therapy however this therapy was declined (details above), and may reconsider if current home therapies fail to control symptoms. We will check eosinophil counts and total IgE, IgE specific for multiple perennial ag to further explore suspected atopy component. Other workup as below.  -cbc with diff, total IgE, IgE specific for multiple perennial ag, Asp to IgE  -Inhalers were discussed but declined    #Multifocal pulmonary nodules  Multiple stable pulmonary nodules observed on CT 03/2020, largest 2.8cm based on most recent CT in 06/2020. Findings c/f adenoCa. Clinically there is a history of smoking. Following Interventional Pulm for monitoring  and possible biopsy. Given nodules have not progressed and no new nodules were noted on most 06/2020 CT, plan is for followup CT chest in Nov, and based on results revisit bx. Findings less likely due to an infectious etiology, and favoring adenoca, but more definitively ruling out infection with workup below. Otherwise plan to review imaging with radiology.  -sputum cultures bacterial, AFB, fungal  -cocci, histo, aspergillus, crypto ag  -next surveillance CT Nov 2022 (order placed by Dr. Al Pimple), following with Interventional Pulm  -review chest imaging with radiology      -----------------------  #Studies Ordered: sputum cx (bacterial, fungal, afb), cocci, asp, histo, crypto  #Prescriptions: none  #Referrals: none  #Follow Up: 60mo or PRN if symptoms worsened    Sincerely,     Thank you for consulting [facility]. Patient seen and plan discussed with Dr. Niel Hummer, attending pulmonologist. Please let us know if you have any questions        Filbert Schilder, MD.  Pulmonary & Critical Care Fellow  Pager: [phone]

## 2020-07-05 NOTE — Progress Notes (Signed)
07/05/2020 pre screening done. No vitals taken. Pt reports no pain or fever. ZO    .1. Are you currently in the state of [place] for this Telehealth clinic visit? yes    2. Do you give consent to be seen for this clinic visit using Telehealth? Yes     3. Under [employer], clinic visits cannot be recorded. If you are recording this clinic visit using the WebEx software, we will be able to see the Recording button and we will end the clinic visit. Do you agree to not record this clinic visit? yes

## 2020-07-28 ENCOUNTER — Telehealth: Payer: Self-pay | Admitting: Family Medicine

## 2020-07-28 NOTE — Telephone Encounter (Signed)
Copied from CRM [id number]. Topic: Medicare AWV >> Jul 28, 2020 11:49 AM Harris-Coley, Avon Gully wrote: Reason for CRM: Left message for patient to schedule Annual Wellness Visit.  Please schedule with Health Nurse Advisor Clare Gandy. at [facility].

## 2020-12-07 ENCOUNTER — Telehealth (HOSPITAL_BASED_OUTPATIENT_CLINIC_OR_DEPARTMENT_OTHER): Payer: Self-pay

## 2020-12-21 ENCOUNTER — Telehealth: Payer: Self-pay | Admitting: Family Medicine

## 2020-12-21 NOTE — Telephone Encounter (Signed)
Left message for patient to call back and schedule Medicare Annual Wellness Visit (AWV) in office.  ° °If not able to come in office, please offer to do virtually or by telephone.  Left office number and my jabber #[phone]. ° °Due for AWVI ° °Please schedule at anytime with Nurse Health Advisor. °  °

## 2021-02-13 ENCOUNTER — Ambulatory Visit: Admit: 2021-02-13 | Discharge: 2021-02-22 | Payer: MEDICARE | Attending: Obstetrics & Gynecology | Primary: Physician

## 2021-02-13 DIAGNOSIS — Z01419 Encounter for gynecological examination (general) (routine) without abnormal findings: Secondary | ICD-10-CM

## 2021-02-13 NOTE — Progress Notes (Signed)
This is an independent service.  The available consultant for this service is Jaynie Crumble, MD.       CC: Gina Spence is a 66 y.o. female here for Annual Exam      Preferred Learning Method:  Verbal  Barriers to Learning:  No barriers to learning identified.    HPI: Gina Spence is here for routine gyn exam. She is healthy and happy. 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 paps and HPV normal since. Not SA in 11 years. Walks daily. mammo last year on [place] a lot. Some anxiety - BP elevated today because she felt she was anxious since the morning. mammos on [employer]      Menopausal symptoms: none  Menopause was  15 ago  Last Pap was 2 years ago  History of abnormal Pap? Yes - HSIL  Treatment for abnormal pap?  No - preferred surveillance  Sexually active?  no  Urinary symptoms? none  Breast complaints: No  Last mammogram: 1 year ago, results negative  Exercise: daily  Calcium intake: adequate    Patient's allergies, medications, past medical, surgical, family and social histories were reviewed and updated as appropriate.    Review of Systems: Pertinent items are noted in HPI.           Objective:     BP (!) 192/98   Ht 185.4 cm (6\' 1" )   Wt 70.3 kg (155 lb)   BMI 20.45 kg/m   General: alert, appears stated age and cooperative  Thyroid: normal to inspection and palpation  Lymph: Cervical, supraclavicular, and axillary nodes normal.  Breasts: normal appearance, no masses or tenderness  Abdomen: soft, non-tender, without masses or organomegaly  Ext gen: normal general appearance  BUS:  normal  Vagina: normal mucosa without prolapse or lesions  Cervix: normal appearance  Uterus: normal size  Adnexae: no mass, fullness, tenderness  Skin: Skin color, texture, turgor normal. No rashes or lesions  Extremities: extremities normal, atraumatic, no cyanosis or edema        ASSESSMENT/PLAN: Gina Spence who is 66 y.o.yo No obstetric history on file. here for routine gynecologic  examination. Exam normal today.     Orders Placed This Encounter    Gynecologic Cytology       Patient counseled about Cervical cancer screening    Follow up:  One year. Would continue annual pap/HPV given HSIL was only 4 years ago

## 2021-02-13 NOTE — Telephone Encounter (Signed)
This encounter was completed by a [place] from the [facility] (located in [place]) supporting all primary care with health maintenance workflows.    Patient was contacted via automated call for follow up depression screening. Patient completed the PHQ-9 through MyChart.    Patients total score on the PHQ-9 was 23 and on question #9, Over the last 2 weeks, how often have you been bothered by thoughts that you would be better off dead, or of hurting yourself?, patient answered: 3 - Nearly every day.    [place] Navigator  [facility]    Future Appointments   Date Time Provider [place]   02/13/2021  2:20 PM Marlynn Perking, NP [id number]   02/16/2021 11:00 AM Jon Billings Hinda Kehr, MD [place]

## 2021-02-13 NOTE — Telephone Encounter (Signed)
Tried to call pt re: high BP but reached her vm. Lvm for pt to return call. Mychart message sent to follow up as well.

## 2021-02-14 NOTE — Telephone Encounter (Signed)
Spoke to patient to confirm we already have her scheduled this Friday, 1/13 with Dr. Al Pimple.  Aware it may change to video

## 2021-02-15 NOTE — Telephone Encounter (Signed)
Gina Spence pt returning your call back

## 2021-02-15 NOTE — Telephone Encounter (Signed)
Attempted to call pt re: high BP but went straight to vm. Lvm for pt to return call.   MyChart message also sent to follow up.

## 2021-02-16 ENCOUNTER — Ambulatory Visit: Admit: 2021-02-16 | Payer: MEDICARE | Attending: Gerontology | Primary: Physician

## 2021-02-16 ENCOUNTER — Inpatient Hospital Stay: Admit: 2021-02-16 | Discharge: 2021-02-21 | Payer: MEDICARE | Primary: Physician

## 2021-02-16 ENCOUNTER — Inpatient Hospital Stay: Admit: 2021-02-16 | Discharge: 2021-02-28 | Payer: MEDICARE | Primary: Physician

## 2021-02-16 DIAGNOSIS — R0789 Other chest pain: Secondary | ICD-10-CM

## 2021-02-16 LAB — TROPONIN I: Troponin I: <0.02 ug/L (ref 0.00–0.04)

## 2021-02-16 MED ORDER — COLOSTRUM, BOVINE ORAL
ORAL | 0.00 refills | Status: DC
Start: 2021-02-16 — End: 2022-11-27

## 2021-02-16 MED ORDER — DULOXETINE 60 MG CAPSULE,DELAYED RELEASE
60 | ORAL | Status: DC
Start: 2021-02-16 — End: 2021-02-22

## 2021-02-16 NOTE — Progress Notes (Signed)
This is an independent service.  The available consultant for this service is Eddie Dibbles L. Scherry Ran, MD.        Subjective    Gina Spence is a 66 y.o. female with 66 y.o. female with bladder cancer, depression/anxiety, COPD/emphysema, remote smoking history, and b/l ggos on CT , who presents with the following:    Chief Complaint            Hypertension 2 days ago, high bp at provider (192/98)   C/o mind fog, dizziness, palpitations over the year, hx of anxiety and depression, stopped Duloxetine and Ativan 2 months ago              History of Present Illness   Worried about BPs, has wrist cuff from home which has been reading up to 200's  Calibrated against our cuff in clinic:  217/114 BP on her home BP cuff  When compared with our cuff: 131/81    Chest feels tight all the time, L sided chest, started noticing in May, became more apparent in Aug  Always there, waxes and wanes in intensity   No radiation, sometimes feels lightheaded, has palpitations, feels heart is fluttering during episodes and some dizziness  No other  associated sxs like sweating/nausea or SOB   Chest tight worsened by heightened levels of stress  relieved by klonopin/lorazepam  No r/t exertion.  Can walk at moderate pace (15 min/mile) 2-3 hours without having to stop  Shortness of breath overall  improved since last CT in Feb  No LE edema, orthopnea or PND     Does not have a home right now, has been staying with different friends      Allergies/Contraindications  No Known Allergies  Outpatient Encounter Medications as of 02/16/2021   Medication Sig Dispense Refill    COLOSTRUM, BOVINE ORAL Take by mouth      DULoxetine (CYMBALTA) 60 mg DR capsule Take 60 mg by mouth daily      Lactobacillus acidophilus (PROBIOTIC ORAL) Take by mouth      digestive enzymes CAP Take by mouth      LORazepam (ATIVAN) 1 mg tablet Take 1 tablet (1 mg total) by mouth daily as needed for Anxiety 30 tablet 0     Facility-Administered Encounter Medications as of  02/16/2021   Medication Dose Route Frequency Provider Last Rate Last Admin    sodium hyaluronate (EUFLEXXA) 10 mg/mL(mw 2.4 -3.6 million) injection syringe 20 mg  2 mL Intraarticular Q7 Days [place] Nena Alexander. [place], PA-C   20 mg at 09/15/18 1622     Past Medical History:   Diagnosis Date    Abnormal Pap smear of cervix 2018    I cured cervical Dysplasia with essential oils cannabis oil and medicinal mushrooms. There have been no reoccurrence cents.    Bladder cancer (CMS code)     Depression with anxiety     Hearing problem 2000    Intestinal disease     Leaky Gut    Lung cancer (CMS code) March 2022    Very early stage    Osteoarthritis     Sleep disorder 1958    Always    Urinary tract infection        Past Surgical History:   Procedure Laterality Date    APPENDECTOMY  1965    Ruptured for 24 hours    CESAREAN SECTION  05/19/90    REPLACEMENT TOTAL KNEE Left 12/2014    Dr Purcell Mouton at [facility]  TONSILLECTOMY  1958    TRANSURETHRAL RESECTION OF BLADDER TUMOR  01/2017    Bladder cancer     Family History   Problem Relation Name Age of Onset    Lung cancer Mother Mother     Depression Mother Mother     Alcohol abuse Mother Mother     Anxiety disorder Mother Mother     Lung disease Mother Mother     Mental illness Mother Mother     Obesity Mother Mother     Liver cancer Father Father     Alcohol abuse Father Father     Diabetes Father Father     Mental illness Father Father        Social History     Tobacco Use    Smoking status: Former     Packs/day: 1.00     Years: 25.00     Pack years: 25.00     Types: Cigarettes     Quit date: 02/04/1997     Years since quitting: 24.0    Smokeless tobacco: Never   Substance and Sexual Activity    Alcohol use: Yes     Alcohol/week: 14.0 standard drinks     Types: 14 Glasses of wine per week     Comment: A glass of wine every night     Drug use: Yes     Types: Marijuana     Comment: Once a day at bedtime.     Sexual activity: Not Currently   Social History  Narrative    From [place]. Moved to [facility] in 1992.    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     Shares house with 3 tenants; she is on the top floor.     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     Appearance: Normal appearance. not ill-appearing, toxic-appearing or diaphoretic.   Cardiovascular:      Rate and Rhythm: Normal rate and regular rhythm.      Pulses: Normal pulses.      Heart sounds: Normal heart sounds.   Pulmonary:      Effort: Pulmonary effort is normal.      Breath sounds: Normal breath sounds.      Speaking in full sentences  Abdominal:      General: Abdomen is flat. Bowel sounds are normal.      Palpations: Abdomen is soft, NTTP, no rebound or gaurding.  Musculoskeletal:      Cervical back: Normal range of motion and neck supple. No rigidity or tenderness.      Right lower leg: No edema.      Left lower leg: No edema.   Lymphadenopathy:      Cervical: No cervical adenopathy.   Skin:     General: Skin is warm and dry.   Neurological:  General: No focal deficit present.      Mental Status: alert and oriented to person, place, and time.   Psychiatric:         Mood and Affect: tearful      Behavior: Behavior normal.         Review of Prior Testing  HEART SCORE  give 2 / 1 / 0 points     HISTORY 0  High / Mod / Low suspicion     EKG  1  ST ? / LBBB, repol ? / Normal  (ST ?, dynamic T ? Admit)     AGE 20  ? 77 / 87-64 / ? 36     RISKS 0  ?3 or known CAD / 1-2 / None  *Risks: HL, HTN, DM, Tob, FHx, Obese     TROPONIN 0  0.12 / 0.05-0.08 / <0.05  (If trop >0.12 or not chronic elev, Admit)      Total HEART Score 3      12 lead showing NSR with sinus arrhythmia, anterior infarct age undetermined; new when compared with prior     Assessment and Plan            Chest tightness   66 y.o. female with bladder cancer, depression/anxiety, COPD/emphysema, remote smoking history, and b/l ggos on CT presenting with persistent  L chest tightness first noticed in May, then acutely worsened in Aug, has been progressively worsening since.  Exacerbated by stress and improved with klonopin/ativan. Likely r/t  anxiety surrounding her CA diagnosis, as symptoms not exertional and she has excellent exercise capacity, heart score of 3, and sxs relieved by benzo (though literature shows benzos can also help relieve ischemic CP due to reduction in cardiac activation).  However, anxiety should be a diagnosis of exclusion and 12 lead does show possible old anterior infarct.   She has also had some poorly controlled BP in the past few months (though her home BP erroneous -  90 point difference in systolic when compared to in office measurement)  Despite home BP cuff being inacurate, she does demonstrate in-office readings that are quite high - last seen 02/13/21 and BP at that time 192/98.  Recommend urgent stress test to r/o ischemia or arrhythmia given fluttering sensation during episodes.  We were able to get her scheduled for stress test this afternoon.    *Trop drawn solely for purposes of calculating heart score, not to r/o ACS.     -     Troponin I; Future  -     Treadmill Stress Echo; Future  -     Stress EKG Monitoring; Future  -     ECG 12 Lead; Future          I, Gentry Fitz, NP, spent a total of 30 minutes on this patient's care on the day of their visit excluding time spent related to any billed procedures and non-overlapping with time spent by an Attending Physician. This time includes time spent with the patient as well as time spent documenting in the medical record, reviewing patient's records and tests,  obtaining history, placing orders, communicating with other healthcare professionals, counseling the patient, family, or caregiver, and/or care coordination for the diagnoses above. Attending Physician time for this visit was not applicable.

## 2021-02-16 NOTE — H&P (Signed)
CARDIAC STRESS TEST PRE-PROCEDURE H&P      Referring Provider  Gentry Fitz, NP    Procedure  Treadmill Stress Echo    CC/Indication  Chest tightness    HPI  Gina Spence  66 yo female with history of bladder cancer s/p resection in 2018 is referred to r/o ischemia after presenting to Urgent Care for chest tightness.  Patient reports 8 months of dull substernal chest ache that's mostly constant.  She's had a lot of mental stress due to her job and dx of lung CA last year.  She normally walks and swims for exercise without worsening chest pain or symptoms.  Of note, she's also noticed that her BP has been high (SBP 190s).    PMH  Past Medical History:   Diagnosis Date    Abnormal Pap smear of cervix 2018    I cured cervical Dysplasia with essential oils cannabis oil and medicinal mushrooms. There have been no reoccurrence cents.    Bladder cancer (CMS code)     Depression with anxiety     Hearing problem 2000    Intestinal disease     Leaky Gut    Lung cancer (CMS code) March 2022    Very early stage    Osteoarthritis     Sleep disorder 1958    Always    Urinary tract infection        Past Surgeries  Past Surgical History:   Procedure Laterality Date    APPENDECTOMY  1965    Ruptured for 24 hours    CESAREAN SECTION  05/19/90    REPLACEMENT TOTAL KNEE Left 12/2014    Dr Purcell Mouton at [facility]  01/2017    Bladder cancer       Social Hx  Social History     Socioeconomic History    Marital status: Divorced     Spouse name: Not on file    Number of children: Not on file    Years of education: Not on file    Highest education level: Not on file   Occupational History    Not on file   Tobacco Use    Smoking status: Former     Packs/day: 1.00     Years: 25.00     Pack years: 25.00     Types: Cigarettes     Quit date: 02/04/1997     Years since quitting: 24.0    Smokeless tobacco: Never   Substance and Sexual Activity    Alcohol  use: Yes     Alcohol/week: 14.0 standard drinks     Types: 14 Glasses of wine per week     Comment: A glass of wine every night     Drug use: Yes     Types: Marijuana     Comment: Once a day at bedtime.     Sexual activity: Not Currently   Other Topics Concern    Not on file   Social History Narrative    From [place]. Moved to [facility] in 1992.    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     Shares house with 3 tenants; she is on the top floor.     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 [place]    "I'm a hippy"; interested in "natural healing without  side effects"     Social Determinants of Company secretary Strain: Not on file   Food Insecurity: Not on file   Transportation Needs: Not on file       Family Hx  Father had MI (unknown age)    Allergies  Allergies/Contraindications  No Known Allergies    Medications  Prior to Admission medications    Medication Instructions   COLOSTRUM, BOVINE ORAL Take by mouth   digestive enzymes CAP Take by mouth   DULoxetine (CYMBALTA) 60 mg DR capsule Take 60 mg by mouth daily   Lactobacillus acidophilus (PROBIOTIC ORAL) Take by mouth   LORazepam (ATIVAN) 1 mg tablet Take 1 tablet (1 mg total) by mouth daily as needed for Anxiety       ROS  Review of Systems   Cardiovascular: Positive for chest pain.        Physical Exam  Gen: NAD, Alert & oriented x 3  Cardiac: RRR, normal S1/S2, no m/r/g appreciated  Pulmonary: CTA bilaterally, no wheezes    Pertinent Data  Results for orders placed or performed in visit on 02/15/20   ECG 12 Lead   Result Value Ref Range    Ventricular Rate 79 BPM    Atrial Rate 79 BPM    P-R Interval 128 ms    QRS Duration 78 ms    QT Interval 338 ms    QTcb 387 ms    Calculated P Axis 60 degrees    Calculated R Axis 9 degrees    Calculated T Axis 67 degrees    Impression    Normal sinus rhythm  Normal ECG  No previous ECGs available  Confirmed by Colin Benton, VASANTH  ([id number]) on 02/16/2020 2:20:35 PM           Plan  Treadmill Stress Echo, Standard [facility] protocol    Consent  Risks (such as possible signs/symptoms including but not limited to CP, SOB, arrhythmias, syncope, abnormal vitals, rarely a heart attack or cardiac complication); benefits; and alternatives of procedure (such as coronary angiogram) explained and consented.      Comments/Results  See Cardiology tab for final report.      Randon Goldsmith, NP [phone]  [facility]

## 2021-02-16 NOTE — Telephone Encounter (Addendum)
Called stress test results to patient   No answer, VM left explaining stress test was negative, and tp follow up with PCP and oncologist

## 2021-02-16 NOTE — Progress Notes (Deleted)
Gina Spence is a 66 y.o. female patient here with a chief complaint of No chief complaint on file.    Problem based HPI and assessment and plan      Problem List Items Addressed This Visit    None       Physical Exam:   Cardiovascular: Normal rate, regular rhythm and normal heart sounds.   No murmur heard.  Pulmonary/Chest: Effort normal and breath sounds normal. No respiratory distress. She has no wheezes.   General: Alert. Appears stated age.  No acute distress.  Head: Normocephalic, without obvious abnormality.    Eyes: Anicteric sclera.Lids & lashes normal, normal conjunctiva.   Ears: Normal pinna  Neck: no obvious masses on inspection    Lungs: Normal respiratory effort, speaking complete sentences.    Psych: normal affect & mood   Skin: Dry, no rash   Neuro: Alert      I, Olin Pia am acting as a Education administrator for services provided by Quitman Livings, MD on 02/15/21  11:28 AM.

## 2021-02-17 LAB — ECG 12-LEAD
Atrial Rate: 67 {beats}/min
Calculated P Axis: 53 degrees
Calculated R Axis: -24 degrees
Calculated T Axis: 51 degrees
P-R Interval: 132 ms
QRS Duration: 84 ms
QT Interval: 382 ms
QTcb: 403 ms
Ventricular Rate: 67 {beats}/min

## 2021-02-21 LAB — STRESS EKG MONITORING
Max Diastolic BP: 104 mmHg
Max Heart Rate: 150 {beats}/min
Max Predicted Heart Rate: 153 {beats}/min
Max Work Load (METS*10): 85
Max. Systolic BP: 203 mmHg

## 2021-02-21 LAB — ECG 12-LEAD
Atrial Rate: 75 {beats}/min
Calculated P Axis: 61 degrees
Calculated R Axis: 1 degrees
Calculated T Axis: 51 degrees
P-R Interval: 142 ms
QRS Duration: 74 ms
QT Interval: 376 ms
QTcb: 419 ms
Ventricular Rate: 75 {beats}/min

## 2021-02-22 ENCOUNTER — Ambulatory Visit: Admit: 2021-02-22 | Discharge: 2021-02-22 | Payer: MEDICARE | Attending: Physician | Primary: Physician

## 2021-02-22 DIAGNOSIS — F419 Anxiety disorder, unspecified: Secondary | ICD-10-CM

## 2021-02-22 MED ORDER — DULOXETINE 30 MG CAPSULE,DELAYED RELEASE
30 | ORAL_CAPSULE | Freq: Every day | ORAL | 1 refills | Status: DC
Start: 2021-02-22 — End: 2021-06-26

## 2021-02-22 MED ORDER — LORAZEPAM 1 MG TABLET
1 | ORAL_TABLET | Freq: Every day | ORAL | 0 refills | Status: DC | PRN
Start: 2021-02-22 — End: 2021-06-26

## 2021-02-22 MED ORDER — LORAZEPAM 1 MG TABLET
1 | ORAL_TABLET | Freq: Every day | ORAL | 0 refills | Status: DC | PRN
Start: 2021-02-22 — End: 2021-02-22

## 2021-02-22 MED ORDER — VITAJOY DAILY D ORAL
ORAL | 3.00 refills | 84.00000 days | Status: DC
Start: 2021-02-22 — End: 2022-11-27

## 2021-02-22 MED ORDER — DULOXETINE 30 MG CAPSULE,DELAYED RELEASE
30 | ORAL_CAPSULE | Freq: Every day | ORAL | 1 refills | Status: DC
Start: 2021-02-22 — End: 2021-02-22

## 2021-02-22 NOTE — Patient Instructions (Signed)
It was very nice seeing you in clinic today.    I have shared my note with you.     Problem List Items Addressed This Visit          Other    Panic attacks     Prescribed Ativan to take 1 mg as needed.  Please let me know if symptoms persist, worsen, or if any new symptoms arise.         Relevant Medications    LORazepam (ATIVAN) 1 mg tablet    DULoxetine (CYMBALTA) 30 mg DR capsule    Anxiety - Primary              Relevant Medications    LORazepam (ATIVAN) 1 mg tablet    DULoxetine (CYMBALTA) 30 mg DR capsule    Elevated blood-pressure reading without diagnosis of hypertension       ASSESSMENT AND PLAN  Recommended logging blood pressure at home for 2-3 weeks when relaxed.  Please let me know if blood pressure is consistently above 140/90 and we should start medication  Follow-up in 3 weeks           You can always email me on my chart or call my office if you have any questions after your visit    This note was created using Dragon voice recognition software. Some errors in proofreading can occur. Please contact me if there are errors or changes need to be modified or if there are any questions.

## 2021-02-22 NOTE — Progress Notes (Signed)
Gina Spence is a 66 y.o. female patient here with a chief complaint of Follow-up    Problem based HPI and assessment and plan    Problem List Items Addressed This Visit        Other    Panic attacks     HPI  Panic attacks are getting worse.  Chest tightness with anxiety  CT scan and stress echo were normal.  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  Please let me know if symptoms persist, worsen, or if any new symptoms arise.  Please let me know if you experience any side effects.         Relevant Medications    LORazepam (ATIVAN) 1 mg tablet    DULoxetine (CYMBALTA) 30 mg DR capsule    Anxiety - Primary     HPI  Saw oncologist today, growth in lung nodules   referred to thoracic oncology  Does no have a support system in [facility] and only here mostly for doctors appointments.  Having frequent panic attacks-uses Ativan very sparingly.  Requesting 36-month supply of Ativan and Cymbalta which i have given  Seeing therapist regularly  Ran out of Cymbalta about 4 months ago.  Was very helpful with anxiety and would like to restart again.  Takes 30 mg 3 times a day  Side effects discussed in detail   return precautions discussed           Relevant Medications    LORazepam (ATIVAN) 1 mg tablet    DULoxetine (CYMBALTA) 30 mg DR capsule    Elevated blood-pressure reading without diagnosis of hypertension     BP Readings    02/16/21 125/81           Blood pressure today was 140/90 at home  Very anxious    ASSESSMENT AND PLAN  Recommended logging blood pressure at home for 2-3 weeks when relaxed.  Please let me know if blood pressure is consistently above 140/90 and we should start medication  Follow-up in 3 weeks           Physical Exam   General: Patient appearing well, alert.    I performed this evaluation using  real-time telehealth tools, including a live video Zoom connection between my location and the patient's location. Prior to initiating, the patient consented to perform this evaluation using telehealth tools. My location is in a [facility] clinical facility.    I, Barkley Boards am acting as a Education administrator for services provided by Quitman Livings, MD on 02/22/21  4:27 PM.  The above scribed documentation accurately reflects the services I have provided.    Quitman Livings, MD   02/22/2021 4:29 PM  I spent a total of 30 minutes on this patient's care on the day of their visit excluding time spent related to any billed procedures. This time includes time spent with the patient as well as time spent documenting in the medical record, reviewing patient's records and tests, obtaining history, placing orders, communicating with other healthcare professionals, counseling the patient, family, or caregiver, and/or care coordination for the diagnoses above.

## 2021-02-22 NOTE — Assessment & Plan Note (Deleted)
HPI  Saw oncologist today, referred for surgery.  Does no have a support system in [place].

## 2021-02-22 NOTE — Assessment & Plan Note (Signed)
HPI  Panic attacks are getting worse.  Chest tightness with pain near heart.  CT scan and stress echo were normal.  Has tried many interventions without improvement.    ASSESSMENT AND PLAN  Prescribed Ativan to take 1 mg as needed.  Please let me know if symptoms persist, worsen, or if any new symptoms arise.  Please let me know if you experience any side effects.

## 2021-02-22 NOTE — Assessment & Plan Note (Addendum)
BP Readings from Last 3 Encounters:   02/16/21 125/81   02/13/21 (!) 192/98   04/13/20 (!) 141/74     ASSESSMENT AND PLAN  Recommended logging blood pressure at home for 2-3 weeks when relaxed.  Please let me know if blood pressure is consistently above 140/90.

## 2021-02-22 NOTE — Assessment & Plan Note (Addendum)
HPI  Saw oncologist today, growth in lung nodules   referred to thoracic oncology  Does no have a support system in [facility] and only here mostly for doctors appointments.  Having frequent panic attacks-uses Ativan very sparingly.  Requesting 91-month supply of Ativan and Cymbalta which i have given  Seeing therapist regularly  Ran out of Cymbalta about 4 months ago.  Was very helpful with anxiety and would like to restart again.  Takes 30 mg 3 times a day  Side effects discussed in detail   return precautions discussed

## 2021-06-27 MED ORDER — LORAZEPAM 1 MG TABLET
1 | ORAL_TABLET | Freq: Every day | ORAL | 0 refills | 10.00000 days | Status: DC | PRN
Start: 2021-06-27 — End: 2021-10-04

## 2021-06-27 MED ORDER — DULOXETINE 30 MG CAPSULE,DELAYED RELEASE
30 mg | ORAL_CAPSULE | Freq: Every day | ORAL | 1 refills | Status: AC
Start: 2021-06-27 — End: 2021-10-04

## 2021-08-17 ENCOUNTER — Telehealth: Admit: 2021-08-17 | Discharge: 2021-08-17 | Payer: MEDICARE | Attending: Physician | Primary: Physician

## 2021-08-17 DIAGNOSIS — G243 Spasmodic torticollis: Secondary | ICD-10-CM

## 2021-08-17 NOTE — Patient Instructions (Addendum)
It was very nice seeing you in clinic today.    I have shared my note with you.     Problem List Items Addressed This Visit          Nervous    Cervical dystonia - Primary     Discussed differential diagnosis of cervical dystonia  I would like to see you for an in person exam especially if symptoms don't improve  Referral to neurology. You should receive a call to schedule an appointment.  Please let me know if symptoms persist, worsen, or if any new symptoms arise.            Relevant Orders    Ambulatory referral to Neurology - Movement Disorders       Respiratory    Pulmonary nodules     I have recommended making a follow-up appointment with your surgeon to discuss all concerns in detail and come up with a plan  after she sees a surgeon specially if any questions I am happy to communicate with her surgeon as well  Seeing oncology -medical and surgery at [facility]              Other    Depression     I  strongly recommended consulting with a psychiatrist   Follow-up in 4 weeks   Taking Cymbalta and Ativan as needed for panic attacks  please let me know if symptoms persist or worsen or any new symptoms      Suicide Prevention Safety Plan:  Call 9-1-1 or go to your nearest emergency room if you think you might do something to hurt yourself.  Call the suicide hotline 9-8-8 when you want someone to talk to (open 24/7).             You can always email me on my chart or call my office if you have any questions after your visit    This note was created using Dragon voice recognition software. Some errors in proofreading can occur. Please contact me if there are errors or changes need to be modified or if there are any questions.

## 2021-08-17 NOTE — Progress Notes (Signed)
Gina Spence is a 66 y.o. female patient here with a chief complaint of follow up    Problem based HPI and assessment and plan      Problem List Items Addressed This Visit          Nervous    Cervical dystonia - Primary     HPI  Recently her neck has been trembling/ shaking  involuntarily   No other neurological symptoms except as noted    ASSESSMENT AND PLAN  Discussed differential diagnosis of cervical dystonia  I would like to see you for an in person exam especially if symptoms don't improve  Referral to neurology. You should receive a call to schedule an appointment. If you are not contacted in 1-2 weeks, please let me know.  Please let me know if symptoms persist, worsen, or if any new symptoms arise.  return precautions discussed              Relevant Orders    Ambulatory referral to Neurology - Movement Disorders       Respiratory    Pulmonary nodules     HPI  Currently pursuing naturopathic treatment  Her oncologist referred her to the surgeon as she did not want to do chemotherapy or radiotherapy per patient  She saw her surgeon and she is uncomfortable about doing a resection without a biopsy first  She feels like her oncologist and surgeon at [facility] are not communicating with each other    Impression:  1. 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 Former smoker     ASSESSMENT AND PLAN  I have recommended making a follow-up appointment with her surgeon to discuss all her concerns in detail and come up with a plan  after she sees a suPsychologist, sport and exercisepecially if any questions I am happy to communicate with her surgeon as well  [facility]            Other    Depression      Situational in the context of her cancer diagnosis   [place] and feels that is a good environment for her   No active suicidal ideation   Sees a therapist regularly   She is a therapist herself and still seeing patients   I have strongly recommended she see a psychiatrist -she would prefer not to see a psychiatrist or make any medication changes today   Taking Cymbalta and Ativan as needed for panic attacks             Physical Exam:   General: Patient appearing well, alert.       I performed this evaluation using real-time telehealth tools, including a live video Zoom connection between my location and the patient's location. Prior to initiating, the patient consented to perform this evaluation using telehealth tools.         I, FiOlin Piam acting as a scEducation administratoror services provided by PrQuitman LivingsMD on 08/19/21  4:22 PM.    The above scribed documentation accurately reflects the services I have provided.    PrQuitman LivingsMD   08/19/2021 4:22 PM  I spent a total of 40  minutes on this patient's care on the day of their visit excluding time spent related to any billed procedures. This time includes time spent with the patient as well as time spent documenting in the medical record, reviewing patient's records and tests, obtaining history, placing orders, communicating with other healthcare professionals, counseling the patient, family, or caregiver, and/or care coordination for the diagnoses above.

## 2021-08-17 NOTE — Assessment & Plan Note (Addendum)
HPI  Recently her neck has been trembling/ shaking  involuntarily   Yesterday she was talking to a client on Zoom who pointed out her neck was trembling  No other neurological symptoms except as noted    ASSESSMENT AND PLAN  Discussed differential diagnosis of cervical dystonia  Happy to see you for an in person exam especially if symptoms don't improve  Referral to neurology. You should receive a call to schedule an appointment. If you are not contacted in 1-2 weeks, please let me know.  Please let me know if symptoms persist, worsen, or if any new symptoms arise.

## 2021-08-17 NOTE — Assessment & Plan Note (Addendum)
Situational in the context of her cancer diagnosis   [place] friend[place] now and feels that is a good environment for her   No active suicidal ideation   Sees a therapist regularly   She is a therapist herself and still seeing patients   I have strongly recommended she see a psychiatrist -she would prefer not to see a psychiatrist or make any medication changes today   Taking Cymbalta and Ativan as needed for panic attacks

## 2021-08-17 NOTE — Assessment & Plan Note (Addendum)
HPI  Currently pursuing naturopathic treatment  Her oncologist referred her to the surgeon as she did not want to do chemotherapy or radiotherapy per patient  She saw her surgeon and she is uncomfortable about doing a resection without a biopsy first  She feels like her oncologist and surgeon at [facility] are not communicating with each other    Impression:  1. 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 Former smoker     ASSESSMENT AND PLAN  I have recommended making a follow-up appointment with her surgeon to discuss all her concerns in detail and come up with a plan  after she sees a suPsychologist, sport and exercisepecially if any questions I am happy to communicate with her surgeon as well  [facility]

## 2021-10-04 ENCOUNTER — Inpatient Hospital Stay: Admit: 2021-10-04 | Payer: MEDICARE | Primary: Physician

## 2021-10-04 DIAGNOSIS — Z1231 Encounter for screening mammogram for malignant neoplasm of breast: Secondary | ICD-10-CM

## 2021-10-08 MED ORDER — LORAZEPAM 1 MG TABLET
1 | ORAL_TABLET | Freq: Every day | ORAL | 0 refills | 10.00000 days | Status: DC | PRN
Start: 2021-10-08 — End: 2021-11-10

## 2021-10-08 MED ORDER — DULOXETINE 30 MG CAPSULE,DELAYED RELEASE
30 | ORAL_CAPSULE | Freq: Every day | ORAL | 1 refills | 30.00000 days | Status: DC
Start: 2021-10-08 — End: 2022-11-11

## 2021-11-10 MED ORDER — LORAZEPAM 1 MG TABLET
1 | ORAL_TABLET | Freq: Every day | ORAL | 0 refills | 10.00000 days | Status: DC | PRN
Start: 2021-11-10 — End: 2021-12-04

## 2021-12-04 MED ORDER — LORAZEPAM 1 MG TABLET
1 | ORAL_TABLET | Freq: Every day | ORAL | 0 refills | 10.00000 days | Status: DC | PRN
Start: 2021-12-04 — End: 2022-03-12

## 2022-03-12 MED ORDER — LORAZEPAM 1 MG TABLET
1 mg | ORAL_TABLET | Freq: Every day | ORAL | 0 refills | Status: DC | PRN
Start: 2022-03-12 — End: 2022-11-14

## 2022-04-25 IMAGING — MG DIGITAL SCREENING BILAT W/ TOMO W/ CAD
6 of 10 series · 6 of 30 positions shown · non-contrast
Comparison: Previous exam(s).

CLINICAL DATA: Screening.

EXAM:
DIGITAL SCREENING BILATERAL MAMMOGRAM WITH TOMO AND CAD

[L MLO synth-2D]
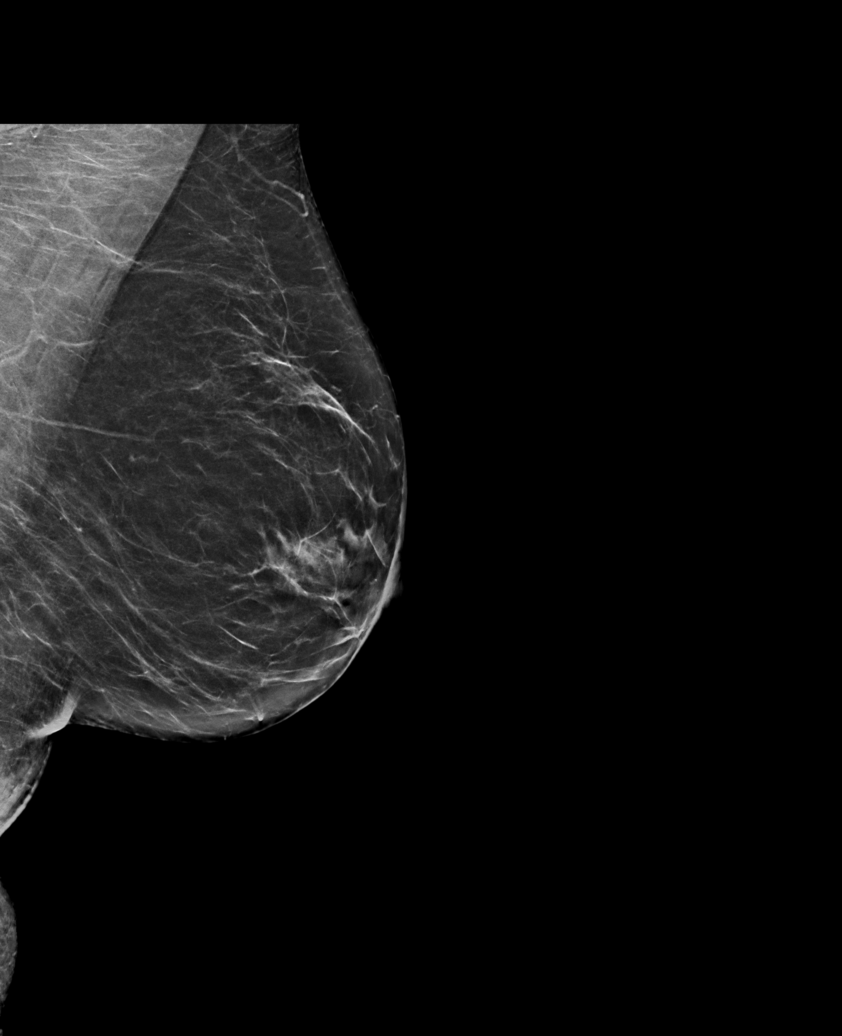

[L CC synth-2D]
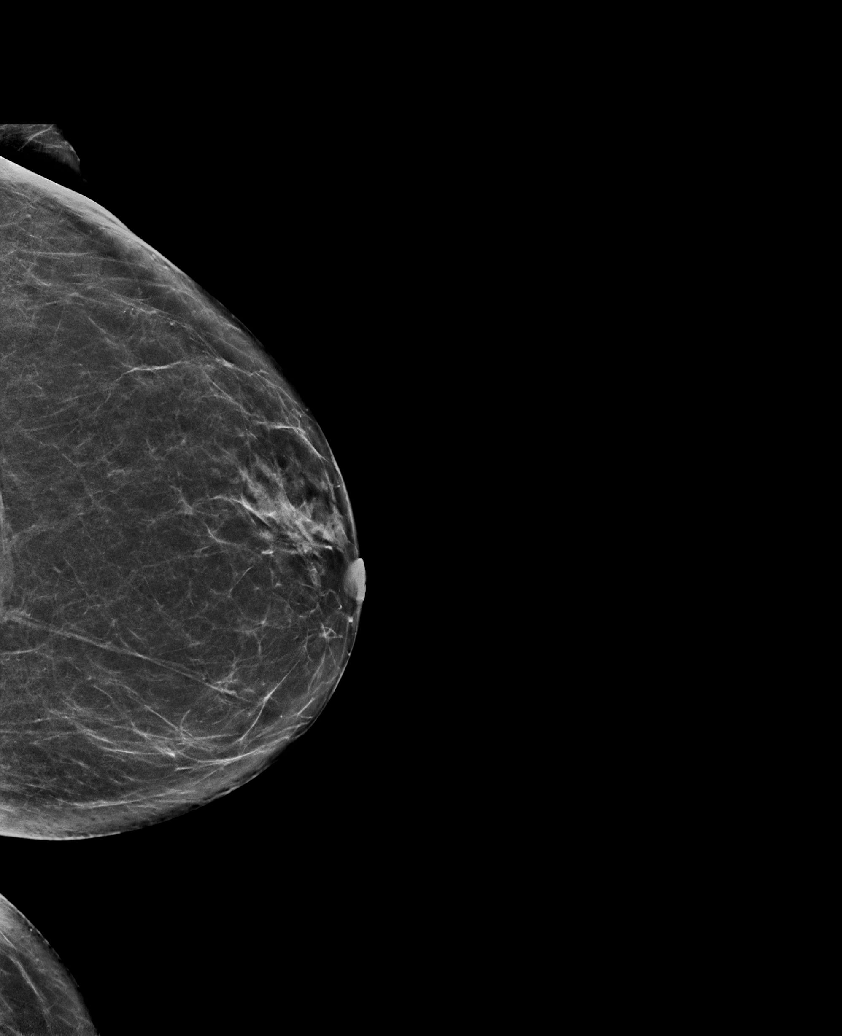

[R XCCL synth-2D]
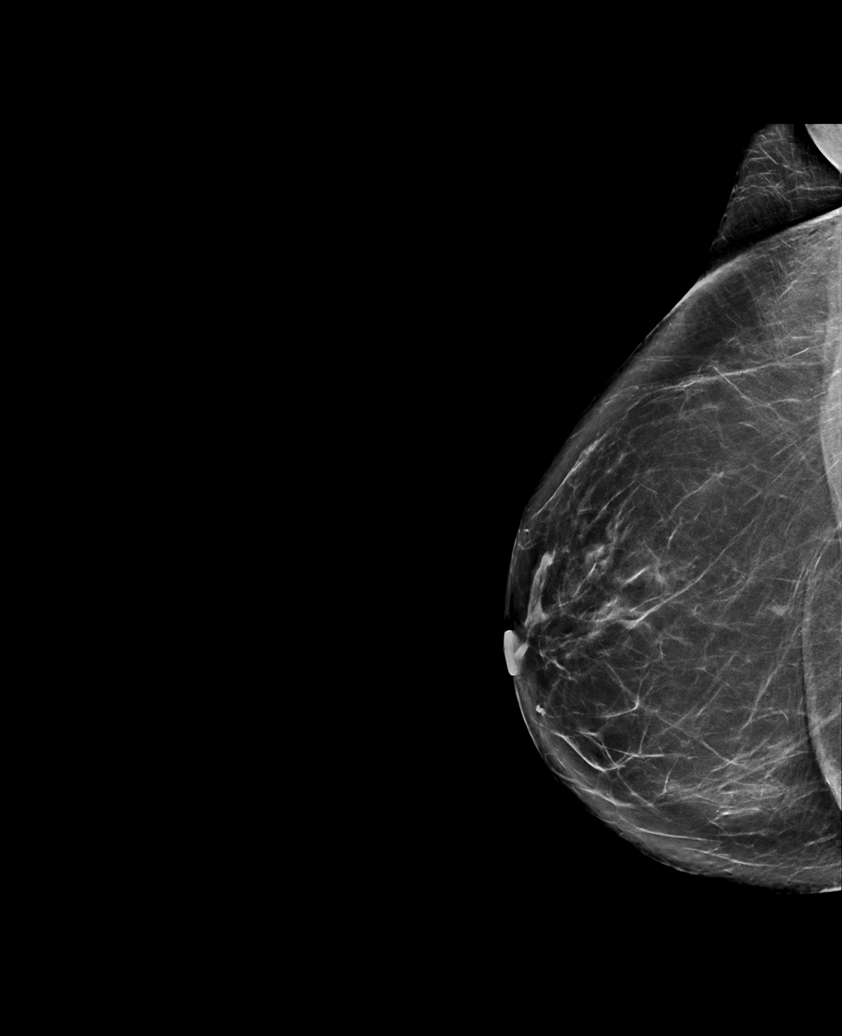

[R MLO synth-2D]
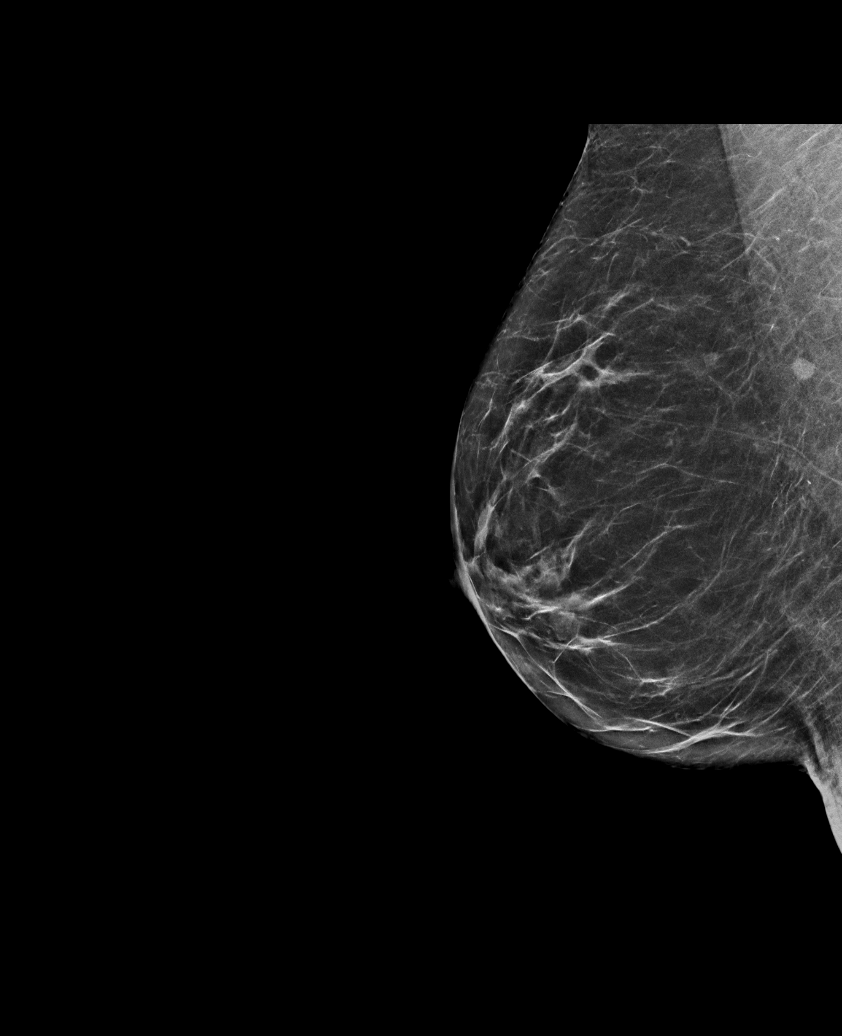

[R CC synth-2D]
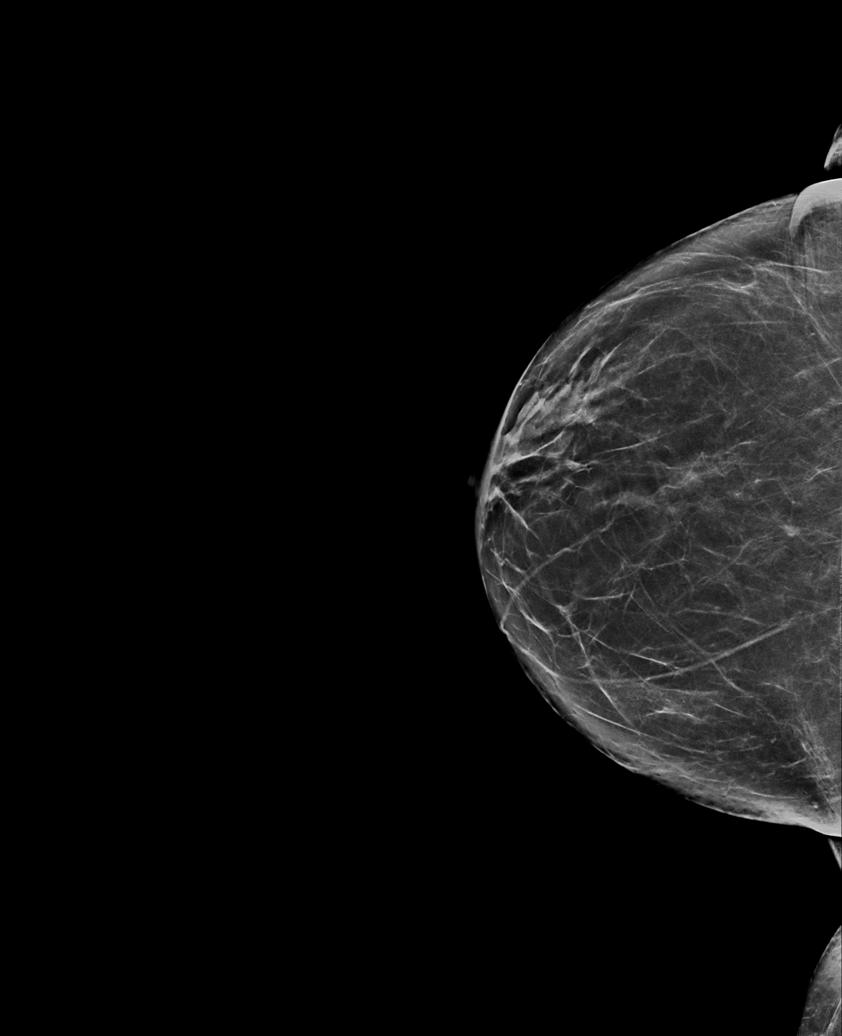

[R MLO tomo · tomo slice 31/61.0]
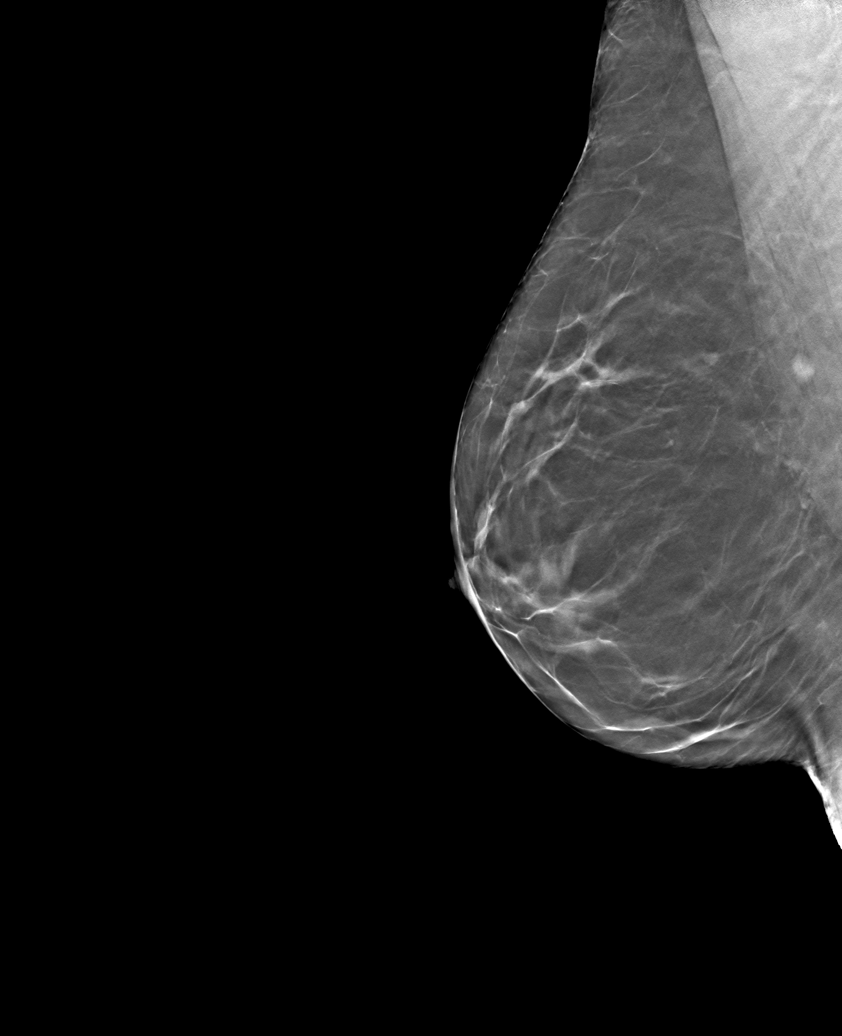

[6 of 30 positions shown; findings below may reference images not displayed]

ACR Breast Density Category b: There are scattered areas of
fibroglandular density.
FINDINGS: There are no findings suspicious for malignancy. Images were
processed with CAD.
IMPRESSION: No mammographic evidence of malignancy. A result letter of this
screening mammogram will be mailed directly to the patient.

RECOMMENDATION:
Screening mammogram in one year. (Code:[id number])

BI-RADS CATEGORY  1: Negative.

## 2022-10-15 NOTE — Telephone Encounter (Signed)
Copied from CRM [id number]. Topic: [facility] - General  >> Oct 09, 2022  4:03 PM Marcelle Smiling wrote:  The patient called and is requesting a referral for her routine pap smear to the [facility] in [place]. Please fax the referral to [phone]. There phone number is [phone]. Please give the pt a call back when the referral has been sent. Thanks.

## 2022-10-29 ENCOUNTER — Telehealth: Admit: 2022-10-29 | Discharge: 2022-10-29 | Payer: MEDICARE | Attending: Physician | Primary: Physician

## 2022-10-29 DIAGNOSIS — C3411 Malignant neoplasm of upper lobe, right bronchus or lung: Secondary | ICD-10-CM

## 2022-10-29 NOTE — Patient Instructions (Signed)
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 Post-surgical pathology report upgraded the diagnosis to stage 2B with two biomarkers identified.  - Patient has expressed a strong preference against chemotherapy and radiation therapy.  - Ordered a follow-up CT scan to assess post-surgical status.  - Referral to thoracic oncology department at [facility] for re-evaluation and second opinion on cancer diagnosis and management plan.  - Referral to social work for assistance with filing formal complaints regarding post-surgical care experience at [facility].                 Contact us for new or worsening symptoms or any other concerns.    Lonny Prude, MD     Will check MyChart for After Visit Summary (AVS)    This clinic note was partially dictated using voice recognition software.  I review the report to remove errors generated by the software, but I apologize for any errors that I did not detect.

## 2022-10-29 NOTE — Progress Notes (Signed)
11/13/2022 Apt Thoracic oncology     Specimen: Tissue/Bone - Biopsy  Component 8 mo ago   Summary (see full report for details and interpretations)    This summary describes the results of next generation sequencing-based mutational profiling using the Stanford Actionable Mutation Panel (STAMP) for solid tumors, version 4.3.3. All variants considered "pathogenic" or "likely pathogenic" are reported   here. For additional details on the variants detected as well as the full list of variants (including variants of uncertain significance) and methodologic details, please see the complete PDF report in EPIC. Estimated tumor mutation burden is reported   for research use only and should be confirmed by another method if clinically actionable.     MUTATIONAL PROFILING BY STAMP          --        BRAF V600E mutation identified, VAF 16%          --        SETD2 U5427* mutation identified, VAF 16%          --        SETD2 Q2127* mutation identified, VAF 12%          --        Estimated tumor mutation burden (TMB): 5.3 mutations per megabase   Resulting Agency [facility])   Specimen Collected: 02/15/22 09:57    Performed by: [facility] (STARMAP) Last Resulted: 03/07/22 12:14   Received From: [facility]  Result Received: 10/15/22 10:37           Subjective:      Gina Spence is a 67 y.o. female who presents for  video visit for     History of Present Illness    Lung Cancer:  - Initial diagnosis of stage 0 lung cancer following a CT scan after experiencing dyspnea post-knee replacement in January 2020.  - Underwent multiple CT scans with no significant changes observed.  - Sought second opinions from oncologists at [facility].  - Advised by oncologists to adopt a "wait and see" approach.  - Referred to Dr. Gery Pray, a surgeon at [facility], who recommended surgery due to concerns about potential metastasis.  - Underwent a wedge resection on January 12th.  - Post-surgery,  informed by oncologist at [facility] that pathology report indicated stage 2B lung cancer with two biomarkers.  - Oncologist advised against chemotherapy or radiation, citing a 40% chance of recurrence and a 60% chance of no further issues.  - Expressed dissatisfaction with the treatment and communication received at [facility].  - Seeking a second opinion and further evaluation of her cancer diagnosis and management.  - Requests a follow-up CT scan to assess the current state of her lungs post-surgery.  - Denies suicidal ideation, but expresses a preference for a "quick death" over chemotherapy or radiation if cancer recurs.         I obtained consent from the Patient or Surrogate Decision Maker, as well as from all individuals accompanying the patient, to record and utilize a transcription to assist with the creation of documentation of the visit. I also obtained consent from any others recorded during the encounter.               Past Medical History:   Diagnosis Date    Abnormal Pap smear of cervix 2018    I cured cervical Dysplasia with essential oils cannabis oil and medicinal mushrooms. There have been no reoccurrence cents.  Bladder cancer (CMS code)     Depression with anxiety     Hearing problem 2000    Intestinal disease     Leaky Gut    Lung cancer (CMS code) March 2022    Very early stage    Osteoarthritis     Sleep disorder 1958    Always    Urinary tract infection        Current Outpatient Medications on File Prior to Visit   Medication Sig Dispense Refill    LORazepam (ATIVAN) 1 mg tablet Take 1 tablet (1 mg total) by mouth daily as needed for Anxiety 30 tablet 0    cholecalciferol, vitamin D3, (VITAJOY DAILY D ORAL) Take by mouth (Patient not taking: Reported on 08/17/2021)      COLOSTRUM, BOVINE ORAL Take by mouth (Patient not taking: Reported on 02/22/2021)      DULoxetine (CYMBALTA) 30 mg DR capsule Take 3 capsules (90 mg total) by mouth daily 270 capsule 1    Lactobacillus acidophilus (PROBIOTIC  ORAL) Take by mouth (Patient not taking: Reported on 10/29/2022)       Current Facility-Administered Medications on File Prior to Visit   Medication Dose Route Frequency Provider Last Rate Last Admin    sodium hyaluronate (EUFLEXXA) 10 mg/mL(mw 2.4 -3.6 million) injection syringe 20 mg  2 mL Intraarticular Q7 Days [facility] Charlyne Quale, PA-C   20 mg at 09/15/18 1622       Past Surgical History:   Procedure Laterality Date    APPENDECTOMY  1965    Ruptured for 24 hours    CESAREAN SECTION  05/19/90    REPLACEMENT TOTAL KNEE Left 12/2014    Dr Lorre Nick at [place]    TONSILLECTOMY  1958    TRANSURETHRAL RESECTION OF BLADDER TUMOR  01/2017    Bladder cancer         Patient's allergies, medications, labs, and past medical histories were reviewed and updated as appropriate.    ROS      There were no vitals taken for this visit.    Physical Exam       Constitutional:  Pleasant, alert, healthy-appearing female in no acute distress  Head:  Normocephalic without obvious abnormality   Eye:  Conjunctiva, lids, and cornea clear bilaterally   Neck: No visible masses   Pulm: No cough.  I:E 1:2. Speaks in long sentences without difficulty  Skin:  Generally clear  Neuro:  Non-focal   Psych Full and appropriate mood, normal affect, appropriate behavior, normal speech, appropriate dress, and organized thought processes  Assessment/Plan     # Malignant neoplasm of upper lobe of right lung (CMS code) (C34.11)  - Patient has a history of wedge resection performed on January 12th, with initial diagnosis of stage 0 lung cancer based on CT scans. Post-surgical pathology report upgraded the diagnosis to stage 2B with two biomarkers identified.  - Patient has expressed a strong preference against chemotherapy and radiation therapy.  - Ordered a follow-up CT scan to assess post-surgical status.  - Referral to thoracic oncology department at [facility] for re-evaluation and second opinion on cancer diagnosis and management plan.  - Referral to social  work for assistance with filing formal complaints regarding post-surgical care experience at [facility].                 Contact us for new or worsening symptoms or any other concerns.    Lonny Prude, MD     Will check MyChart for  After Visit Summary (AVS)    This clinic note was partially dictated using voice recognition software.  I review the report to remove errors generated by the software, but I apologize for any errors that I did not detect.      I spent a total of 30 minutes on this patient's care on the day of their visit excluding time spent related to any billed procedures. This time includes time spent with the patient as well as time spent documenting in the medical record, reviewing patient's records and tests, obtaining history, placing orders, communicating with other healthcare professionals, counseling the patient, family, or caregiver, and/or care coordination for the diagnoses above.

## 2022-11-12 NOTE — Progress Notes (Signed)
SOCIAL WORK NOTE    Bailie Courtney is a 67 y.o. female who was referred to Social Work for assist with care coordination of Onc.    Data:     Patient Information  Referral Source: Medical Care Team  Referral Name: Lonny Prude, MD  Presenting Concerns: Other (comment) (care coordination)  Mode of Contact: Phone  Source of Information: Patient  Interpreter Needed?: No  Services/Teams: Primary Care Clinic    SW reviewed patient's chart and attempted to make contact without success. A voicemail was left for patient with SW contact info and hours of availability. SW did not leave specific reason for call in order protect the patient's confidentiality. SW invited patient to return call at [phone].     SW also reached out to the Thoracic Oncology Medical clinic [phone] and spoke with their rep who confirmed that they rec'd the referral and the next steps are for the patient coordinator to review her records and they will reach out to the patient in 3-5 days.   Assessment:     Patient needing support with getting care at [facility] Thoracic ONC.    Interventions/Plan:     Interventions Provided: Attempted outreach, Chart review, Collateral contact, Patient/family education  Time Spent: 30 min    Community Resource Referrals:    SW to continue to try and connect.             Sheela Stack, LCSW  Primary Care Social Worker  [phone]

## 2022-11-13 NOTE — Progress Notes (Signed)
SOCIAL WORK NOTE    Gina Spence is a 67 y.o. female who was referred to Social Work for care coordination.    Data:     Patient Information  Referral Source: Medical Care Team  Referral Name: Sammie Bench, MD  Presenting Concerns: Other (comment)  Mode of Contact: Phone  Source of Information: Patient  Interpreter Needed?: No  Services/Teams: Primary Care Clinic    SW rec'd call back from patient who rec'd SW message and was now able to talk.     Patient reports that she was receiving care at [facility] for her lung CA and was very dissatisfied with the care and would like to return to [facility] Thoracic Onc. SW informed patient a referral was made and SW confirmed that the clinic received it. SW explained that they will call within 3-5 days and if they don't call by Monday, patient can call to follow-up.    Patient then reported that she had a terrible experience after surgery at [facility] and she does not know who to report it  to. SW suggested calling their Patient Relations. Patient reported she did and did not hear. SW then provided info about the [employer] who oversee care facilities to call and make complaint.    Patient denied further needs. SW provided contact info for any emerging issues.     Assessment:   Patient is A&Ox3 and able to make needs known. Patient is hoping to get reconnected to [place] for her lung CA. SW provided problem solving interventions and supportive listening. Patient is capable of following up with resources provided.     Interventions/Plan:     Interventions Provided: Chart review, Care coordination, Community referral, Orientation to Social Work services, Equities trader education, Supportive counseling  Time Spent: 45 min    Community Resource Referrals:   Systems developer of [employer]           SW to remain available.     Sheela Stack, LCSW  Primary Care Social Worker  [phone]

## 2022-11-14 MED ORDER — LORAZEPAM 1 MG TABLET
1 mg | ORAL_TABLET | Freq: Every day | ORAL | 0 refills | Status: DC | PRN
Start: 2022-11-14 — End: 2022-11-25

## 2022-11-19 NOTE — Telephone Encounter (Signed)
LM with front desk. They are sending a message to the team to verify what CT scan

## 2022-11-20 NOTE — Telephone Encounter (Signed)
Changed order to her local hospital

## 2022-11-20 NOTE — Telephone Encounter (Signed)
Copied from CRM [id number]. Topic: [facility] - General  >> Nov 20, 2022  3:16 PM Cala Bradford D wrote:  A representative from [facility] called because they received an order for the patient for a CT Scan with contrast. They said they wouldn't be able to see the patient until she had an eGFR blood test done. She said that patient would like to go to a [facility] in [place]. She gave the [facility] number which is [phone]. She would like for our office to notify the patient once the lab order has been placed so the patient knows when to go. If there are any questions, please give [facility] a call back at [phone]. Thanks.

## 2022-11-20 NOTE — Telephone Encounter (Signed)
Faxed form to [facility].

## 2022-11-22 LAB — BASIC METABOLIC PANEL (NA, K, CL, CO2, BUN, CR, GLU, CA)
BUN/Creatinine Ratio (External Lab): 22 (ref 12–28)
Calcium, total, Serum / Plasma: 9.8 mg/dL (ref 8.7–10.3)
Carbon Dioxide, Total: 25 mmol/L (ref 20–29)
Chloride, Serum / Plasma: 99 mmol/L (ref 96–106)
Creatinine: 0.67 mg/dL (ref 0.57–1.00)
Glucose, non-fasting: 92 mg/dL (ref 70–99)
Potassium, Serum / Plasma: 4.4 mmol/L (ref 3.5–5.2)
Sodium, Serum / Plasma: 138 mmol/L (ref 134–144)
Urea Nitrogen, serum/plasma: 15 mg/dL (ref 8–27)
eGFRcr: 95 mL/min/{1.73_m2} (ref >59)

## 2022-11-25 MED ORDER — LORAZEPAM 1 MG TABLET
1 mg | ORAL_TABLET | Freq: Every day | ORAL | 0 refills | Status: DC | PRN
Start: 2022-11-25 — End: 2022-11-27

## 2022-11-27 ENCOUNTER — Telehealth: Admit: 2022-11-27 | Discharge: 2022-11-27 | Payer: MEDICARE | Attending: Physician | Primary: Physician

## 2022-11-27 DIAGNOSIS — C3490 Malignant neoplasm of unspecified part of unspecified bronchus or lung: Secondary | ICD-10-CM

## 2022-11-27 MED ORDER — LORAZEPAM 1 MG TABLET
1 | ORAL_TABLET | Freq: Every day | ORAL | 1 refills | 10.00000 days | Status: DC | PRN
Start: 2022-11-27 — End: 2023-05-09

## 2022-11-27 NOTE — Assessment & Plan Note (Addendum)
Surgery last January at [facility] was very traumatic open discharge plans   Felt like she was not given many options for treatment except for being told to live in a healthy way   Per her oncologist note   Lung cancer is now staged as a stage IIb given 3 different foci of disease, although the largest section measuring 1.1 cm, otherwise node-negative  She has an appointment next month to establish care at [facility]

## 2022-11-27 NOTE — Assessment & Plan Note (Addendum)
Overall prefers not to take medications regularly for anxiety  Generally well-managed with meditation and lifestyle changes but for panic attacks-uses Ativan very sparingly.    Seeing therapist regularly  Also seeing an astrologer and  2 Shaman's and doing a lot of mind work which has been helpful for her  Side effects discussed in detail   return precautions discussed  We discussed referral to Dr. Marcelle Overlie at our clinic and medication but would prefer to defer at this time

## 2022-11-27 NOTE — Patient Instructions (Addendum)
It was very nice seeing you in clinic today.    I have shared my note with you.     Problem List Items Addressed This Visit       Anxiety     please let me know if symptoms persist or worsen or any new symptoms      Suicide Prevention Safety Plan:  Call 9-1-1 or go to your nearest emergency room if you think you might do something to hurt yourself.  Call the suicide hotline 9-8-8 when you want someone to talk to (open 24/7).        Relevant Medications    LORazepam (ATIVAN) 1 mg tablet    Malignant neoplasm of lung (CMS code) - Primary     She has an appointment next month to establish care at [facility] can always email me on my chart or call my office if you have any questions after your visit    This note was created using Dragon voice recognition software. Some errors in proofreading can occur. Please contact me if there are errors or changes need to be modified or if there are any questions.

## 2022-11-27 NOTE — Progress Notes (Signed)
Gina Spence is a very nice 67 y.o. female patient here with a chief complaint of Medication Refill (Ativan 1 mg)    Problem based HPI and assessment and plan      Problem List Items Addressed This Visit       Anxiety     Overall prefers not to take medications regularly for anxiety  Generally well-managed with meditation and lifestyle changes but for panic attacks-uses Ativan very sparingly.    Seeing therapist regularly  Also seeing an astrologer and  2 Shaman's and doing a lot of mind work which has been helpful for her  Side effects discussed in detail   return precautions discussed  We discussed referral to Dr. Marcelle Overlie at our clinic and medication but would prefer to defer at this time           Relevant Medications    LORazepam (ATIVAN) 1 mg tablet    Malignant neoplasm of lung (CMS code) - Primary     Surgery last January at [facility] was very traumatic open discharge plans   Felt like she was not given many options for treatment except for being told to live in a healthy way   Per her oncologist note   Lung cancer is now staged as a stage IIb given 3 different foci of disease, although the largest section measuring 1.1 cm, otherwise node-negative  She has an appointment next month to establish care at [place] [facility]                             A complete Review of systems was otherwise negative    reviewed records    Physical exam  General: Appears stated age.     Psych:anxious and teary               This note was created using Conservation officer, historic buildings. Some errors in proofreading can occur. Please contact me if there are errors or changes need to be modified or if there are any questions.    I performed this consultation using real-time Telehealth tools, including a live video connection between my location and the patient's location.

## 2022-12-11 ENCOUNTER — Telehealth
Admit: 2022-12-11 | Discharge: 2022-12-11 | Payer: MEDICARE | Attending: Student in an Organized Health Care Education/Training Program | Primary: Physician

## 2022-12-11 DIAGNOSIS — C3490 Malignant neoplasm of unspecified part of unspecified bronchus or lung: Secondary | ICD-10-CM

## 2022-12-11 NOTE — Patient Instructions (Signed)
- 

## 2022-12-11 NOTE — Progress Notes (Signed)
Subjective:     IDENTIFICATION: 67 y.o. 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     HISTORY OF PRESENT ILLNESS:  -02/2020: Had a CT scan after she developed shortness of breath following a knee surgery   -02/15/2021 CT chest: Multiple cystic, ground glass, and part solid pulmonary nodules, stable from 03/21/2020. Several are concerning for minimally invasive or invasive adenocarcinoma. Most suspicious lesions include a right apical lesion with a 10 mm solid component and a posterior right upper lobe lesion with a 7 mm solid component.   -06/28/2021 CT chest wo contrast: Compared to 03/11/2020, multiple subsolid and groundglass cystic nodules are mildly increasing in size and/or solid component with dominant lesions in the right upper lobe, concerning for minimally invasive or invasive adenocarcinoma.   -11/05/2021 MRI brain: Chronic small vessel ischemic changes and age-related cerebral volume loss. No acute infarct or intracranial hemorrhage. Mild degenerative disc disease at C3-C4.   -11/07/2021 CT chest wo contrast: As compared to 06/28/2021, overall similar appearance of multiple bilateral adenocarcinoma spectrum lesions, although which are minimally enlarged compared with earlier CTs such as 03/15/2020, with few foci of more solid components likely representing minimally invasive adenocarcinoma.   -02/12/2022 CT chest wo contrast:  Similar to slightly increased size of slowly growing part-solid, groundglass, and cystic pulmonary nodules compatible with adenocarcinoma spectrum lesions (minimally invasive or invasive adenocarcinoma). Few new nonspecific pulmonary nodules. Attention on follow-up.   -02/15/2022: RUL wedge resections x2 and MLNDx with Dr. Allyson Sabal at [facility]   Path: Adenocarcinoma, three separate tumor nodules in the same lobe (pT3) with 0/10  lymph nodes negative for malignancy. Negative surgical margins. Stage IIA (pT3N0)   STAMP: BRAF V600E, SETD2 mutations, TMB 5.3 mut/mb   PD-L1 TPS < 1%  -03/15/2022: Established care with Dr. Johnnye Lana. Interested in more natural and holistic approach and declined adjuvant chemotherapy. Planning CT scans every 6 months. Declined ctDNA monitoring.   -12/05/2022 CT chest wo contrast: Scarring in the right lung consistent with prior treatment. Multifocal areas of faint ground glass opacity in the left lung.     Gina Spence is here alone to establish care for a second opinion about management. She tries not to take any pharmaceuticals and prefers natural approaches. She had been trying to cure her cancer with different alternative methods before she ultimately committed to surgery. She had a bad experience at [facility] following her lung surgery. Says frankly at this point, death would be preferable to her to any additional surgery. She lost a lot of confidence in the medical system due to hearing lots of misinformation about the stage of her cancer. Prior to surgery, she was having some shortness of breath and feeling like her lungs hurt. After the surgery, she felt her frame of mind went downhill. Felt neglected by the medical system. She then says she "dropped the ball," refused to do anything about her health because she felt death was preferable. At this point, she has a mild wheeze. She is very physically active, walks 3 miles a day, meditates. Following [place] medicine. Not sure whether she wants to do routine CT scans as she isn't sure she would want treatment if a recurrence were detected. Tearful at times describing her experience; declines referral to psycho-oncology or psychiatric oncology to help with coping.     Local Oncology: Johnnye Lana, [facility]  Past Medical History:   Diagnosis Date    Abnormal Pap smear of cervix 2018    I cured cervical Dysplasia with essential oils cannabis oil and medicinal  mushrooms. There have been no reoccurrence cents.    Bladder cancer (CMS code)     Depression with anxiety     Hearing problem 2000    Intestinal disease     Leaky Gut    Lung cancer (CMS code) March 2022    Very early stage    Osteoarthritis     Sleep disorder 1958    Always    Urinary tract infection      Past Surgical History:   Procedure Laterality Date    APPENDECTOMY  1965    Ruptured for 24 hours    CESAREAN SECTION  05/19/90    REPLACEMENT TOTAL KNEE Left 12/2014    Dr Lorre Nick at [facility]    TONSILLECTOMY  1958    TRANSURETHRAL RESECTION OF BLADDER TUMOR  01/2017    Bladder cancer       Allergies/Contraindications  No Known Allergies     Current Outpatient Medications   Medication Sig Dispense Refill    Lactobacillus acidophilus (PROBIOTIC ORAL) Take by mouth (Patient not taking: Reported on 10/29/2022)      LORazepam (ATIVAN) 1 mg tablet Take 1 tablet (1 mg total) by mouth daily as needed for Anxiety 15 tablet 1     Current Facility-Administered Medications   Medication Dose Route Frequency Provider Last Rate Last Admin    sodium hyaluronate (EUFLEXXA) 10 mg/mL(mw 2.4 -3.6 million) injection syringe 20 mg  2 mL Intraarticular Q7 Days [facility] Charlyne Quale, PA-C   20 mg at 09/15/18 1622       Family History   Problem Relation Name Age of Onset    Lung cancer Mother Mother     Depression Mother Mother     Alcohol abuse Mother Mother     Anxiety disorder Mother Mother     Lung disease Mother Mother     Mental illness Mother Mother     Obesity Mother Mother     Liver cancer Father Father     Alcohol abuse Father Father     Diabetes Father Father     Mental illness Father Father        Social History     Social History Narrative    From [place]. Moved to [facility] in 1992.    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     Shares house with 3 tenants; she is on the top floor.     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    Objective:     There were no vitals taken for this visit.    /10    ECOG Performance Status: 1 - Symptomatic but completely ambulatory  Constitutional: Well-appearing, no acute distress. Appropriately interactive.  Eyes: No scleral icterus. Conjunctiva clear.   ENMT: Moist mucus membranes. No oral lesions visible.   Lymph: Deferred  Respiratory: Respirations unlabored. Speaking in full sentences  Cardiovascular: Deferred  GI: Deferred  MSK: No peripheral edema.   Skin: No visible rashes  Neuro: Face symmetric. Normal speech.   Psych: Normal mood and affect.  LABORATORY RESULTS   I personally reviewed and interpreted each of the patient's relevant lab tests as outlined in my Assessment/Plan.    Lab Results   Component Value Date    WBC Count 8.3 02/15/2020    Hemoglobin 15.3 02/15/2020    Platelet Count 271 02/15/2020    Sodium, Serum / Plasma 138 11/21/2022    Potassium, Serum / Plasma 4.4 11/21/2022    Chloride, Serum / Plasma 99 11/21/2022    Carbon Dioxide, Total 25 11/21/2022    Urea Nitrogen, serum/plasma 15 11/21/2022    Creatinine 0.67 11/21/2022    eGFR - low estimate 96 02/15/2020    Glucose, non-fasting 92 11/21/2022    Calcium, total, Serum / Plasma 9.8 11/21/2022    Bilirubin, Total 0.4 02/15/2020    AST 20 02/15/2020    Alanine transaminase 21 02/15/2020    Alkaline Phosphatase 66 02/15/2020    Albumin, Serum / Plasma 4.3 02/15/2020       RADIOGRAPHIC RESULTS   I personally reviewed and interpreted each of the patient's relevant imaging studies in conjunction with the corresponding formal radiology reports, as outlined in my Assessment/Plan. Imaging result in HPI above.     Assessment:     Impression: 67 y.o. female with resected stage IIB lung adenocarcinoma (pT3N0cM0 with 3 separate nodules in the same lobe; BRAF V600E mutation, PD-L1 <1%), s/p RUL lobectomy), here for a second opinion regarding  management.     We discussed the pathological diagnosis of lung adenocarcinoma based on review of her RUL lobectomy pathology report. Stamp NGS testing revealed a BRAF V600E mutation.       We then discussed staging. Based on 3 separate tumors in the RUL but no involvement of 10 sampled LN at the time of her lobectomy, she has a stage IIB (pT3N0cM0) tumor. We discussed the fact that stage IIB disease is considered curable, but with a possible chance of recurrence.      We then discussed treatment options. We noted that the standard of care in stage IIB disease is localized in nature with surgical resection. She completed RUL lobectomy at [facility] in 02/15/2022. Adjuvant chemotherapy was recommended after surgery to reduce her recurrence risk, but in the end she declined. Now transitioned to surveillance. She has bee reluctant to get routine imaging due to some doubt about whether she would ever accept additional treatment for her lung cancer. Today, we reviewed CT chest from 12/05/2022 which shows NED; need to CTM a few GGOs in left lung that may be additional adenocarcinoma spectrum lesions. I would recommend repeating CT chest ideally every 6 months for 3 years, then annually. However, Latona is not certain that she wants additional treatment even if we were to identify a recurrence. I tried to encourage her to at least get scans and will order a CT chest in the event that she is willing to get surveillance in the future.     Finally, we discussed supportive care. In terms of symptoms, she is largely asymptomatic at this point.     Plan:     #Lung adenocarcinoma  Stage IIB (pT3N0cM0) s/p RUL lobectomy at [facility]. BRAF V600E mutation, PD-L1 <1% in 02/15/2022.  - Declined adjuvant chemotherapy based on discussions with [facility] oncologist, Dr. Channing Mutters  - Now in surveillance    -Per NCCN guidelines, surveillance for stage I-II NSCLC includes CT chest +/- contrast q6 mo for 2-3 yr, then low dose CT chest noncon annually;  I ordered CT chest but it's  not clear that she's going to pursue routine imaging giving a lot of ambivalence about whether she would want additional treatment  - I ordered CT chest to be done in about 6 montbs     RTC in 6 months after CT chest    The above plan was reviewed with the patient and all questions and issues were addressed to the patients satisfaction.    Below for billing only  I spent a total of 60 minutes on this patient's care on the day of their visit excluding time spent related to any billed procedures. This time includes face-to-face time with the patient as well as time spent documenting in the medical record, reviewing patient's records and tests, obtaining history, placing orders, communicating with other healthcare professionals, counseling the patient, family, or caregiver, and/or care coordination for the diagnoses above.    Medical decision making:  Problems: patient's active cancer represents a life-threatening illness  Risk of complications, morbidity/mortality of patient management: high; the patient's systemic cancer therapy requires regular and intensive monitoring for potential major/life-threatening toxicities    I performed this evaluation using real-time telehealth tools, including a live video Zoom connection between my location and the patient's location. Prior to initiating, I obtained informed verbal consent to perform this evaluation using telehealth tools and answered all the questions about the telehealth interaction. My location is not in a [facility] clinical facility.

## 2022-12-31 NOTE — Telephone Encounter (Signed)
Hi Marla,    Would you be able to put in the order for path slide review for patient?     Provider: Dr. Vista Mink  Referral dx: invasive adenocarcinoma     Facility: [facility]   Date of Service: 01/12/20204  Accession No.: [id number]    Authorization Status: No auth needed    Thank you,  Campbell Lerner

## 2022-12-31 NOTE — Telephone Encounter (Signed)
[facility] Nursing Appropriate Orders Documentation    Treatment Plan Info  Target treatment start date: Path over read needed for treatment planning.  Discipline(s) requested:   Pathology Over Read    Nursing  RN notified patient of referral and referral process?: Yes     RN verified that the referral is routed in AP[facility]X to the primary APP/MD for patient: Yes     RN linked associated diagnosis with referral order: Yes     Plan  Additional information:   I attest that a provider working with me had a face to face encounter with this patient on (enter date): 12/11/22    Provider: Dr. Vista Mink  Referral dx: invasive adenocarcinoma     Facility: [facility]   Date of Service: 01/12/20204  Accession No.: [id number]     Authorization Status: No auth needed

## 2023-01-07 ENCOUNTER — Inpatient Hospital Stay: Payer: MEDICARE | Attending: Anatomic Pathology | Primary: Physician

## 2023-01-07 DIAGNOSIS — C3411 Malignant neoplasm of upper lobe, right bronchus or lung: Secondary | ICD-10-CM

## 2023-01-17 LAB — SURGICAL PATHOLOGY
Pathologist: 72288
Specimen Class: 52
Subspecialty: 15

## 2023-05-09 MED ORDER — LORAZEPAM 1 MG TABLET
1 | ORAL_TABLET | Freq: Every day | ORAL | 0 refills | 6.00000 days | Status: DC | PRN
Start: 2023-05-09 — End: 2023-08-18

## 2023-05-09 NOTE — Telephone Encounter (Signed)
 Patient Comment: I am not a drug addict, my prescription was for 30 1mg  tablets every 3 months. I need 30 tablets to get me throufh 3 months. We are in hellacious times and even with meditating over 2 hours a day. Walking 3 miles in the forest, earing healthy clean food I have a lot of difficulty managing my panic attacks. Please five me the full prescription so that I can have some relief.

## 2023-05-09 NOTE — Telephone Encounter (Signed)
 [facility] on [address] in [place] for 30 1mg  pills?

## 2023-06-02 ENCOUNTER — Telehealth
Admit: 2023-06-02 | Discharge: 2023-06-02 | Payer: MEDICARE | Attending: Student in an Organized Health Care Education/Training Program | Primary: Physician

## 2023-06-02 DIAGNOSIS — C3490 Malignant neoplasm of unspecified part of unspecified bronchus or lung: Secondary | ICD-10-CM

## 2023-06-02 NOTE — Patient Instructions (Signed)
-   Thank you for visiting us  in the [facility] today. It was a pleasure to see you.   - I we will see you back in about 6 months to check in on how you are feeling  - If you have questions in regards to scheduling or administrative inquiries, please contact Odilia Bennett, my practice coordinator, by [employer] or at [phone].  - For clinical advice, please call the Main Clinic Number at [phone]. Select peferred language, then option 4 for the Registered Nurses Line.  - If any issues arise before your next appointment, especially fever or significant change in breathing, please call our clinic at [phone].

## 2023-06-02 NOTE — Progress Notes (Signed)
 Subjective:     IDENTIFICATION: 68 y.o. 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     HISTORY OF PRESENT ILLNESS:  -02/2020: Had a CT scan after she developed shortness of breath following a knee surgery   -02/15/2021 CT chest: Multiple cystic, ground glass, and part solid pulmonary nodules, stable from 03/21/2020. Several are concerning for minimally invasive or invasive adenocarcinoma. Most suspicious lesions include a right apical lesion with a 10 mm solid component and a posterior right upper lobe lesion with a 7 mm solid component.   -06/28/2021 CT chest wo contrast: Compared to 03/11/2020, multiple subsolid and groundglass cystic nodules are mildly increasing in size and/or solid component with dominant lesions in the right upper lobe, concerning for minimally invasive or invasive adenocarcinoma.   -11/05/2021 MRI brain: Chronic small vessel ischemic changes and age-related cerebral volume loss. No acute infarct or intracranial hemorrhage. Mild degenerative disc disease at C3-C4.   -11/07/2021 CT chest wo contrast: As compared to 06/28/2021, overall similar appearance of multiple bilateral adenocarcinoma spectrum lesions, although which are minimally enlarged compared with earlier CTs such as 03/15/2020, with few foci of more solid components likely representing minimally invasive adenocarcinoma.   -02/12/2022 CT chest wo contrast:  Similar to slightly increased size of slowly growing part-solid, groundglass, and cystic pulmonary nodules compatible with adenocarcinoma spectrum lesions (minimally invasive or invasive adenocarcinoma). Few new nonspecific pulmonary nodules. Attention on follow-up.   -02/15/2022: RUL wedge resections x2 and MLNDx with Dr. Katheryne Pane at [facility]   Path: Adenocarcinoma, three separate tumor nodules in the same lobe (pT3) with 0/10  lymph nodes negative for malignancy. Negative surgical margins. Stage IIA (pT3N0)   STAMP: BRAF V600E, SETD2 mutations, TMB 5.3 mut/mb   PD-L1 TPS < 1%  -03/15/2022: Established care with Dr. Lazaro Prime. Interested in more natural and holistic approach and declined adjuvant chemotherapy. Planning CT scans every 6 months. Declined ctDNA monitoring.   -12/05/2022 CT chest wo contrast: Scarring in the right lung consistent with prior treatment. Multifocal areas of faint ground glass opacity in the left lung.     Interval History:  Does not want to get surveillance CT scans moving forward.  She feels confident that she would not want to treat lung cancer in the future, and so does not think getting CT scans to detect recurrence or new cancer makes sense.  She is feeling okay overall, no symptoms.  Her breathing is quite good. Doing [employer] breathing.  She has found this to be really helpful.  No recent weight loss right now  She is doing juicing and eating one meal a day.  She did cannabis oil treatment Karolee Paci), lost 40 lbs.   She has decided against getting CT scans.  Plan to check in again in November. Back to looking for a house in [place].   I've sent her a list of integrative resources for cancer patients per her request    Local Oncology: Lazaro Prime, [facility]     Social History     Social History Narrative    From [place] . Moved to [facility] in 1992.    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     Shares house with 3 tenants; she is on the top floor.  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    Objective:     There were no vitals taken for this visit.    /10    ECOG Performance  Status: 1 - Symptomatic but completely ambulatory  Constitutional: Well-appearing, no acute distress. Appropriately interactive.  Eyes: No scleral icterus. Conjunctiva clear.   ENMT: Moist mucus membranes. No oral lesions visible.   Lymph: Deferred  Respiratory: Respirations unlabored. Speaking in full sentences  Cardiovascular: Deferred  GI: Deferred  MSK: No peripheral edema.   Skin: No visible rashes  Neuro: Face symmetric. Normal speech.   Psych: Normal mood and affect.        LABORATORY RESULTS   I personally reviewed and interpreted each of the patient's relevant lab tests as outlined in my Assessment/Plan.    Lab Results   Component Value Date    WBC Count 8.3 02/15/2020    Hemoglobin 15.3 02/15/2020    Platelet Count 271 02/15/2020    Sodium, Serum / Plasma 138 11/21/2022    Potassium, Serum / Plasma 4.4 11/21/2022    Chloride, Serum / Plasma 99 11/21/2022    Carbon Dioxide, Total 25 11/21/2022    Urea Nitrogen, serum/plasma 15 11/21/2022    Creatinine 0.67 11/21/2022    eGFR - low estimate 96 02/15/2020    Glucose, non-fasting 92 11/21/2022    Calcium, total, Serum / Plasma 9.8 11/21/2022    Bilirubin, Total 0.4 02/15/2020    AST 20 02/15/2020    Alanine transaminase 21 02/15/2020    Alkaline Phosphatase 66 02/15/2020    Albumin, Serum / Plasma 4.3 02/15/2020       RADIOGRAPHIC RESULTS   I personally reviewed and interpreted each of the patient's relevant imaging studies in conjunction with the corresponding formal radiology reports, as outlined in my Assessment/Plan. Imaging result in HPI above.     Assessment:     Impression: 68 y.o. female with resected stage IIB lung adenocarcinoma (pT3N0cM0 with 3 separate nodules in the same lobe; BRAF V600E mutation, PD-L1 <1%), s/p RUL lobectomy), here for a second opinion regarding management.     We discussed the pathological diagnosis of lung adenocarcinoma based on review of her RUL lobectomy pathology report. Stamp NGS testing revealed a BRAF V600E mutation.        We then discussed staging. Based on 3 separate tumors in the RUL but no involvement of 10 sampled LN at the time of her lobectomy, she has a stage IIB (pT3N0cM0) tumor. We discussed the fact that stage IIB disease is considered curable, but with a possible chance of recurrence.      We then discussed treatment options. We noted that the standard of care in stage IIB disease is localized in nature with surgical resection. She completed RUL lobectomy at [facility] in 02/15/2022. Adjuvant chemotherapy was recommended after surgery to reduce her recurrence risk, but in the end she declined. Now transitioned to surveillance. She has bee reluctant to get routine imaging due to some doubt about whether she would ever accept additional treatment for her lung cancer. CT chest from 12/05/2022 showed NED; need to CTM  a few GGOs in left lung that may be additional adenocarcinoma spectrum lesions. I would recommend repeating CT chest ideally every 6 months for 3 years, then annually.    Today, Mirtha presents for follow-up.  We had discussed getting a CT scan at this time, 6 months from her last scan.  However in the interim since her last appointment she is given a lot of thought to whether or not she wants to do surveillance imaging.  Similar to our prior discussion, at this point she feels quite strongly that surveillance imaging is not within her treatment goals.  She does not think she would ever want more treatment for lung cancer should we detect a recurrence or a new primary tumor on CT scans, and therefore she does not think the information will be helpful or useful.  She does want to continue to follow-up with me for symptoms.  We discussed that I can see her back in November 2025 to check in about how she is doing.  Should she change her mind in the future, I be happy to order surveillance imaging as she would like.    Finally, we discussed supportive care. In terms of symptoms, she is largely asymptomatic at this  point.  Focusing on holistic/integrative approaches to cancer survivorship including cannabis oil, juicing etc.    Plan:     #Lung adenocarcinoma  Stage IIB (pT3N0cM0) s/p RUL lobectomy at [facility]. BRAF V600E mutation, PD-L1 <1% in 02/15/2022.  - Declined adjuvant chemotherapy based on discussions with [facility] oncologist, Dr. Joanette Moynahan  - Now in surveillance    -Per NCCN guidelines, surveillance for stage I-II NSCLC includes CT chest +/- contrast q6 mo for 2-3 yr, then low dose CT chest noncon annually  - I ordered CT chest to be done in about 6 months (April 2025) but in the end she has decided against doing any surveillance imaging given that she feels quite clear that she would not want additional treatment for a cancer recurrence or new diagnosis  -She does want to continue to follow-up though to discuss symptoms so I will see her back in another 6 months  - We have discussed the nature of her BRAF V600E mutation and the availability of targeted therapy should she ever have recurrence    RTC in 6 months to check in on symptoms    The above plan was reviewed with the patient and all questions and issues were addressed to the patients satisfaction.    Below for billing only  I spent a total of 30 minutes on this patient's care on the day of their visit excluding time spent related to any billed procedures. This time includes face-to-face time with the patient as well as time spent documenting in the medical record, reviewing patient's records and tests, obtaining history, placing orders, communicating with other healthcare professionals, counseling the patient, family, or caregiver, and/or care coordination for the diagnoses above.    I performed this evaluation using real-time telehealth tools, including a live video Zoom connection between my location and the patient's location. Prior to initiating, I obtained informed verbal consent to perform this evaluation using telehealth tools and answered all the questions  about the telehealth interaction. My location is not in a [facility] clinical facility.

## 2023-06-09 NOTE — Telephone Encounter (Addendum)
 Outpatient Oncology Social Work Note      Referral reason:  "68 y.o. 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 Nettie Barb on 06/02/23 for a second opinion regarding management.         05/30/2023    10:13 AM   Supportive Care Needs Screening    Please select the number (0-10, from no distress to extreme distress) that best describes how much distress you have been experiencing in the past 7 days including today. 4   In the past 7 days, have any of the following items been a cause of distress that you feel you cannot manage on your own?  Emotional concerns and stress    Difficulty with sleep or fatigue   I felt fearful      Often   I found it hard to focus on anything other than my anxiety  Often   My worries overwhelmed me  Often   I felt uneasy Often   I felt worthless Often   I felt helpless Often   I felt depressed Often   I felt hopeless Often   Have you lost weight recently without trying?  No   Distress Thermometer 4 (Mild)   Distress Depression Score 16 (Severe)   Distress Anxiety Score 16 (Severe)       Completed SCNS Triage conversation with Patient?: No - patient did not respond to outreach    Were resources provided to patient?: No - unable to connect with patient to complete triage    Patient needs expressed in call current with needs expressed at time of survey?: Unable to connect with patient to complete triage    Physical/somatic needs communicated to medial team: No - unable to connect with patient to complete triage       Assessment: Attempted to contact patient. No answer. Was unable to leave a VM. Sent patient a Wellsite geologist.      Plan:  SW will remain available for support and advocacy.     Arlena Lacrosse LCSW  Outpatient Oncology Social Worker  [facility]  Phone: [phone]

## 2023-08-18 MED ORDER — LORAZEPAM 1 MG TABLET
1 | ORAL_TABLET | Freq: Every day | ORAL | 0 refills | 10.00000 days | Status: DC | PRN
Start: 2023-08-18 — End: 2023-12-05

## 2023-11-28 NOTE — Telephone Encounter (Signed)
 Outreach attempt made to contact a patient to complete their Annual Wellness Visit (AWV): Health Risk Assessment (HRA).     This was the First attempt made to contact the patient.     The result of the outreach was: Spoke to patient; patient will complete HRA online

## 2023-11-28 NOTE — Telephone Encounter (Signed)
 Medicare patient is due for a primary care follow-up visit.   Chart review was completed prior to outreach to confirm eligibility.     First attempt was made to contact the patient.     Patient was attempted by Mychart     Result of the outreach is Outreach in progress     Scheduled F/U? Outreach in progress     If applicable: patient declined because Not Applicable    If applicable: patient is not eligible because Not Applicable

## 2023-12-05 NOTE — Progress Notes (Unsigned)
 Gina Spence is a 68 y.o. female who presents for the following:  No chief complaint on file.    Subjective        HPI (Optional)    # overall health  - is your PMH up to date? ***  - if your family history up to date? ***  - any questions about any chronic health conditions you might have? ***  - any side effects to any medications ***  - any changes to how you are taking medications that are not reflected in our chart? ***  - do you need any refills? ***    # stress, anger, positive SI  Worked as a airline pilot ***    # urinary leakage  PT ***    #hearing loss  Audiology ***    # fear for falls    I reviewed the patient's pertinent demographics, allergies, medications, past medical history, social history and family history from the history section of the electronic health record and made updates as appropriate.    .      I obtained consent from the Patient or Surrogate Decision Maker, as well as from all individuals accompanying the patient, to record and utilize a transcription to assist with the creation of documentation of the visit. I also obtained consent from any others recorded during the encounter.        {AWV Opiate Use:41635}    Review of Systems (Optional)    Patient Care Team:  Evy Candida Ke, MD as PCP - General (Internal Medicine)  Generic Provider Mychart  Chick KATHEE Formosa, MD (Critical Care Medicine)     Health Risk Assessment       Self Reported Health Status  - In general, would you say your health is:: (Patient-Rptd) Good  - Do you feel you have hearing loss?: (!) (Patient-Rptd) Yes  {HRA Comments (Optional):36794::" "}    Behavioral Risks  - Without intending to, have you gained or lost weight during the past 3 months?: (Patient-Rptd) No  - Has your food intake declined over the past 3 months due to loss of appetite, digestive problems, chewing or swallowing difficulties?: (Patient-Rptd) No  - How many times a week do you usually do 30 minutes or more of moderate or  vigorous-intensity physical activity or walking that increases your heart rate or makes you breathe harder than normal? (e.g., jogging, aerobics, bicycling, carrying light loads)?: (Patient-Rptd) 5 or more times a week  - Do you wear a seat belt when in a vehicle? : (Patient-Rptd) Always  - Do you experience urinary leakage when coughing, laughing, running or stooping?: (!) (Patient-Rptd) Sometimes    -  Do you experience urinary leakage before reaching the toilet?: (!) (Patient-Rptd) Often  {HRA Comments (Optional):36794::" "}    Mobility/ADLs/IADLs  -*Clemens in past year?: (Patient-Rptd) No   -*Feels unsteady when standing or walking? : (Patient-Rptd) No  -*Worries about falling?: (!) (Patient-Rptd) Yes  - In the past 7 days, did you need help from others to perform everyday activities?: (Patient-Rptd) No issues  - In the past 7 days, did you need help from others to take care of any of the following things?: (Patient-Rptd) No issues  {HRA Comments (Optional):36794::" "}    Psychosocial Risks  - Do you have any concerns about the following?: (!) (Patient-Rptd) Stress, Anger, Fatigue  - Do you have any concerns about sexual health?: (Patient-Rptd) Not answered  - Do you have someone who would be able to help you in case  of emergency?: (Patient-Rptd) Yes  - Do you have someone to trust and confide in?: (Patient-Rptd) Yes  - Do you feel safe at home?: (Patient-Rptd) Yes  {HRA Comments (Optional):36794::" "}    Depression Risks  -PHQ-1. Little interest or pleasure in doing things: (Patient-Rptd) More than half the days  -PHQ-2. Feeling down, depressed, or hopeless: (Patient-Rptd) More than half the days  -PHQ-2 Total Score: (Patient-Rptd) 4        Social History     Tobacco Use    Smoking status: Former     Current packs/day: 0.00     Average packs/day: 1 pack/day for 25.0 years (25.0 ttl pk-yrs)     Types: Cigarettes     Start date: 02/05/1972     Quit date: 02/04/1997     Years since quitting: 26.8    Smokeless tobacco:  Never   Substance and Sexual Activity    Alcohol use: Yes     Alcohol/week: 14.0 standard drinks of alcohol     Types: 14 Glasses of wine per week     Comment: A glass of wine every night     Drug use: Yes     Types: Marijuana     Comment: Once a day at bedtime.     Sexual activity: Not Currently   Social History Narrative    From [place] . Moved to [facility] in 1992.    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     Shares house with 3 tenants; she is on the top floor.     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  There is no height or weight on file to calculate BMI.     Physical Exam  (Optional)     .        Vision Acuity (Required for Welcome to Medicare, optional for other visits)  {VisionAcuity Screen (Optional):32253}    Cognitive Impairment  {Mini-Cog Results:32295}    Timed Up and Go Test  Timed Up and Go Test: (not recorded)  TUG Interpretation: (not recorded)       EKG (Covered for Welcome to Medicare visit only--not required for AWV)  {MHOP EKG DONE/NOT DONE (Optional):[phone]}           11/29/2023     3:33 AM   PHQ-9 Detail   PHQ-1. Little interest or pleasure in doing things More than half the days    PHQ-2. Feeling down, depressed, or hopeless More than half the days    PHQ-3. Trouble falling or staying asleep, or sleeping too much Nearly every day    PHQ-4. Feeling tired or having little energy More than half the days    PHQ-5. Poor appetite or overeating More than half the days    PHQ-6. Feeling bad about yourself - or that you are a failure or have let yourself or your family down Several days    PHQ-7. Trouble concentrating on things,  such as reading the  newspaper or watching television Nearly every day    PHQ-8. Moving or speaking so slowly that other people could have noticed. Or the opposite - being so fidgety or restless that you have been moving around a lot more than usual Not at all    PHQ-9. Thoughts that you would be better off dead, or of hurting yourself in some way More than half the days    PHQ-2 Total Score 4    PHQ-9 Total Score 17        Patient-reported         Advance Care Planning   (Required for Welcome to Medicare, optional for other visits)    Most recent Advance Care Planning note (blank if none documented)       Advance Directive, POLST, Durable Power of Attorney or Living Will documents (blank if not in scanned document)    Adv. Directive/Power of Research Scientist (medical)):     Advance Care Planning (Required for Welcome to Medicare, covered for other visits)  {ACP Documentation:32247}    {ACP face to face time (Optional):32308}    Assessment & Plan   Problem-Based Assessment and Plan (Optional to capture [facility])  {MISC; PROBLEM NOTES, DIAGNOSES, ORDERS AND LAST A&P TEXT (Optional):35795}    .        {PATIENT CONDITION PREVENTATIVE CARE MANAGEMENT (Optional):36045}     Todays visit included screening for physical exercise, nutrition, height, weight, tobacco use, safety, function, gait, falls, blood pressure, hearing, vision, memory, and depression. Education and counseling was provided as follows:     Here are some recommendations that everyone should consider:    Exercise   Exercise is believed to be the most effective intervention in older adults to improve quality of life and function. The benefits of exercise in older adults include increased mobility, better ability to perform your activities of daily living (ADLs), improved gait, decreased falls, improved bone mineral density, and increased general well-being. We recommend exercising at least 5 days a week, targeting 150 minutes per week, at a level that feels safe to you.   Even modest activity and muscle strengthening can slow the progression of functional limitations.    - Do a form of exercise that you enjoy: swimming, biking, dancing, playing golf  - Exercise with friends or join an exercise group, like golf, water aerobics, bicycling, etc  - Walking is a great form of exercise!  - Try to incorporate resistance and strength training, such as Tai Chi classes  - Try the "Sit and Be Fit" classes that are offered at the [facility]       Fall Risk   - Do a home safety check: do you have grab bars in the bathroom? Are there loose rugs or wires you might trip over?  - Many older folks walk much more safely using mobility aids such as a cane or a walker. If you have had a fall, talk to your provider about what might work best for you and consider a consultation with Physical Therapy for gait and balance training  - Schedule regular eye exams by a healthcare provider: cataracts can develop slowly and impair your vision  - We also recommend trying the "Sit and Be Fit" classes offered at the [facility]. Tai Chi classes have been shown to improve strength and balance, and reduce the incidence of falls  - Vitamin D supplementation also may improve balance and preserve muscle strength; some studies show a reduction in falls with adequate Vitamin D. We recommend a  daily intake of vitamin D of at least 800 to 1000 international units.       Personal and Home Safety  - Make sure that you have working fire and carbon monoxide alarms  - Make sure that you have an emergency action plan in place in case of fire, earthquake or flooding  - Consider getting an emergency alert device to wear in case you hurt yourself and need assistance   - Consider contacting "[employer]" to find out how to get a phone that has an emergency call button. [employer]'s phone number is: [phone]      Counseling was provided for the following individual risk factors: (Optional)  {Risk Factors (Optional):32298}     Recommended  preventative health screenings:  Health Maintenance   Topic Date Due    Hepatitis C Routine Screening  Never done    Pneumococcal Vaccine (1 of 2 - PCV) Never done    Dexa Osteoporosis Scan or Monitor  Never done    Advance Care Planning Discussion (Use .ACP/Scan Document)  Never done    Colorectal Cancer Screening  09/02/2019    Breast Cancer Screening  10/05/2023    Covid-19 Vaccine (4 - 2025-26 season) 10/06/2023    Tobacco Screening  11/27/2023    Depression: Monitoring: Full PHQ-9  01/29/2024    Depression Screening (Annual PHQ2)  Completed    Tdap Immunization  Discontinued    Zoster Vaccines  Discontinued    Influenza Vaccines  Discontinued    Td Immunization  Discontinued       Immunizations/Shots  - Influenza Vaccine: Flu shot- recommended every fall  - Shingrix: This is the new Shingles Vaccine that is over 90% effective. It is a 2 shot series. Check with your local pharmacy for availability and cost.  - Tdap Vaccine (tetanus, diphtheria, pertussis/whooping cough): once as an adult (Td or tetanus diphtheria every 10 years)  - Pneumonia Vaccines: Pneumococcal recommended after 65, consider Prevnar for some patients    Cancer Screening  - Mammogram for Breast Cancer Screening: at least every 2 years starting at age 21 until age 31 (more frequently if you are at higher risk).  - Colon cancer screening: either a colonoscopy every 5-10 years (depending on results), cologuard stool test every 3 years, or a yearly FIT test to detect blood in your stool. Recommended ages 87-75.  - Pap smears for cervical cancer screening: Every 3-5 years (depending on results). Stop at age 49 if have had 3 negatives exams in the past 10 years (or 2 negative tests if you have been tested for HPV).  - Lung cancer screening: recent recommendations suggest that screening for lung cancer with a chest CT in current or previous smokers may detect lung cancer at a treatable stage. Please discuss this with your provider as this is a very  complex decision with risks as well as benefits.    Chronic Disease Screening  - Cholesterol check at least every 5 years (or more often if you have diabetes or heart disease) No results found for: "CHOL", "LDL", "TLDL", "HDL", "TG", "TRID", "CHOLRATIO"  - Diabetes screening at least every 3 years   Most recent relevant labs   Component Value Date    Hemoglobin A1c 5.8 (H) 02/15/2020    (note that HbgA1c is not covered unless you have dm or impaired fasting)   Most recent relevant labs   Component Value Date    Glucose, non-fasting 92 11/21/2022      - Bone  Density Test (DEXA) scan for osteoporosis: this should be obtained in women at age 42. The frequency of optimal re-screening has not been determined. However, if you experience an osteoporotic fracture, you should obtain a repeat study (unless you are already on treatment).       One-time Screenings  - Abdominal Aortic Aneurysm Screen: 1 time aortic ultrasound for men ages 43-75 who have ever smoked, or women who have a family history of aortic aneurysm  - HIV screen: once ages 15-64, more frequently if there are risk factors   - Hepatitis C screen: a one time blood test for all adults 18 years and older                  {Complexity of data reviewed (optional):28233::" "}    {Time spent options (optional):28235::" "}

## 2023-12-07 MED ORDER — LORAZEPAM 1 MG TABLET
1 | ORAL_TABLET | Freq: Every day | ORAL | 0 refills | 10.00000 days | Status: AC | PRN
Start: 2023-12-07 — End: ?

## 2023-12-08 NOTE — Telephone Encounter (Signed)
 Triage    Situation (symptoms, c/o):  This 68 y.o. female pt. is having depression     "It's getting better."  She says she has reconnected with a therapist. "I think I'm in good hands"  Denies suicidal ideation or thoughts of self harm    Background (clinical history and diagnoses):    has a past medical history of Abnormal Pap smear of cervix (2018), Bladder cancer (CMS code), Depression with anxiety, Hearing problem (2000), Intestinal disease, Lung cancer (CMS code) (March 2022), Osteoarthritis, Sleep disorder (1958), and Urinary tract infection.    Assessment (of the triager):   All protocol questions were asked by RN. Only pertinent positives and pertinent negatives are documented    Recommendations:  If YES to any of the following, call 911 now:  No Sounds like a life-threatening emergency to the triager     If YES to any of the following, go to ED now:  No Bizarre or confused behavior     If YES to any of the following, call local agency now:  No Recurrent thoughts of death (e.g., life is not worth living) but not threatening suicide   No Patient sounds very upset or severely depressed (e.g., multiple symptoms of depression)     If YES to any of the following, call local agency today:  Yes Symptoms persisting > 2 weeks    Education and advice provided:  Call back if:  Sadness or depression symptoms persist more than 2 weeks; you want to talk with a counselor; you feel like harming yourself; you become worse.    Referred for appointment: Yes. Scheduled IVV with PCP.     Verbalized Understanding:  Verbalizes understanding and intention to comply with instructions.    PER PROTOCOL FOR DEPRESSION.   Alm FELIX Sebastian: Adult Telephone Triage, Copyright 2000-2013, 3rd Edition.   *Use this protocol if depression is the patients only symptom*

## 2023-12-10 ENCOUNTER — Telehealth: Admit: 2023-12-10 | Discharge: 2023-12-11 | Payer: MEDICARE | Attending: Physician | Primary: Physician

## 2023-12-10 DIAGNOSIS — F32A Depression, unspecified: Secondary | ICD-10-CM

## 2023-12-10 NOTE — Patient Instructions (Signed)
 It was very nice seeing you in clinic today.Please schedule a preventative /annual exam yearly     I have shared my note with you.     Problem List Items Addressed This Visit       Anxiety    Lab work ordered.  Please let me know if symptoms persist, worsen, or if any new symptoms arise.  Return precautions discussed.    Suicide Prevention Safety Plan:  Call 9-1-1 or go to your nearest emergency room if you think you might do something to hurt yourself.  Call the suicide hotline 9-8-8 when you want someone to talk to (open 24/7).          Relevant Orders    Comprehensive Metabolic Panel    Thyroid Stimulating Hormone    Vitamin B12    Complete Blood Count with Differential    Depression - Primary    Social isolation can contribute to depressive symptoms   continue with  healthy lifestyle practices.  Please let me know if symptoms persist, worsen, or if any new symptoms arise.  Return precautions discussed.      Suicide Prevention Safety Plan:  Call 9-1-1 or go to your nearest emergency room if you think you might do something to hurt yourself.  Call the suicide hotline 9-8-8 when you want someone to talk to (open 24/7).            Relevant Orders    Complete Blood Count with Differential    Complete Blood Count with Differential    Comprehensive Metabolic Panel    Thyroid Stimulating Hormone    Vitamin B12    Malignant neoplasm of lung (CMS code)    Reviewed  oncologist note   She has an appointment tomorrow            You can always email me on my chart or call my office if you have any questions after your visit    This note was created using Dragon voice recognition software. Some errors in proofreading can occur. Please contact me if there are errors or changes need to be modified or if there are any questions.

## 2023-12-10 NOTE — Assessment & Plan Note (Addendum)
 Severe anxiety and depression symptoms  Trying to find a therapist  Taking Ativan  1 mg, breaking it into quarters to manage panic symptoms.  Resisting unless there is severe symptoms and does not take it every day  Takes supplements   Engaging in various self-care activities including meditation, walking, exercise, yoga, and Qigong.  Maintains a healthy diet.  Has distanced herself from family and past relationships due to recognition of unhealthy patterns.  Does not have a support system here and mostly lives alone in a [place] the woods and avoids people.  Would not want to change this . spends a few months in [place] every year and does feel better when she is there  No suicidal ideation    ASSESSMENT AND PLAN  Lab work ordered.  We discussed starting medications  but she does not want to take any medications for anxiety and depression or see a psychiatrist at this time  Continue with her healthy lifestyle practices.  Please let me know if symptoms persist, worsen, or if any new symptoms arise.  Return precautions discussed.    Suicide Prevention Safety Plan:  Call 9-1-1 or go to your nearest emergency room if you think you might do something to hurt yourself.  Call the suicide hotline 9-8-8 when you want someone to talk to (open 24/7).

## 2023-12-10 NOTE — Assessment & Plan Note (Signed)
 Reviewed  oncologist note   She has an appointment tomorrow

## 2023-12-10 NOTE — Assessment & Plan Note (Addendum)
 HPI  Reports undergoing a period of emotional and spiritual distress.  Described as "the dark night of the soul," for the past 5 years.  Has isolated herself in a cabin in the woods.  Previously sought therapy but found the therapist did not have the training to address spiritual  concerns.  Expresses a strong detachment from life.  However, no suicidal ideation,   Has resumed painting.  Reports low mood and depressive symptoms in the past.  Describes a preference for natural remedies.  Has a history of familial relationships with narcissism and borderline personality disorders.  Believes this is leading to a pattern of similar relationships in her own life.  Has an ongoing search for a therapist.  Maintains a healthy diet.  Travels to [place] for extended periods.  Lacks a local support system but has friends in [place].      ASSESSMENT AND PLAN  Social isolation can contribute to depressive symptoms   continue with  healthy lifestyle practices.  Please let me know if symptoms persist, worsen, or if any new symptoms arise.  Return precautions discussed.      Suicide Prevention Safety Plan:  Call 9-1-1 or go to your nearest emergency room if you think you might do something to hurt yourself.  Call the suicide hotline 9-8-8 when you want someone to talk to (open 24/7).

## 2023-12-10 NOTE — Progress Notes (Signed)
 Gina Spence is a 68 y.o. female patient here with a chief complaint of Follow-up    Problem based HPI and assessment and plan    Visit Diagnoses       Depression - Primary    Current Assessment & Plan   HPI  Reports undergoing a period of emotional and spiritual distress.  Described as "the dark night of the soul," for the past 5 years.  Has isolated herself [place] a cabin [place] the woods.  Previously sought therapy but found the therapist did not have the training to address spiritual  concerns.  Expresses a strong detachment from life.  However, no suicidal ideation,   Has resumed painting.  Reports low mood and depressive symptoms in the past.  Describes a preference for natural remedies.  Has a history of familial relationships with narcissism and borderline personality disorders.  Believes this is leading to a pattern of similar relationships in her own life.  Has an ongoing search for a therapist.  Maintains a healthy diet.  Travels to [place] for extended periods.  Lacks a local support system but has friends in [place].      ASSESSMENT AND PLAN  Social isolation can contribute to depressive symptoms   continue with  healthy lifestyle practices.  Please let me know if symptoms persist, worsen, or if any new symptoms arise.  Return precautions discussed.      Suicide Prevention Safety Plan:  Call 9-1-1 or go to your nearest emergency room if you think you might do something to hurt yourself.  Call the suicide hotline 9-8-8 when you want someone to talk to (open 24/7).            Relevant Orders    Complete Blood Count with Differential    Complete Blood Count with Differential    Comprehensive Metabolic Panel    Thyroid Stimulating Hormone    Vitamin B12        Anxiety    Current Assessment & Plan   Severe anxiety and depression symptoms  Trying to find a therapist  Taking Ativan  1 mg, breaking it into quarters to manage panic symptoms.  Resisting unless there is severe symptoms and does not take it every  day  Takes supplements   Engaging in various self-care activities including meditation, walking, exercise, yoga, and Qigong.  Maintains a healthy diet.  Has distanced herself from family and past relationships due to recognition of unhealthy patterns.  Does not have a support system here and mostly lives alone in a cabin in the woods and avoids people.  Would not want to change this . spends a few months in [place] every year and does feel better when she is there  No suicidal ideation    ASSESSMENT AND PLAN  Lab work ordered.  We discussed starting medications  but she does not want to take any medications for anxiety and depression or see a psychiatrist at this time  Continue with her healthy lifestyle practices.  Please let me know if symptoms persist, worsen, or if any new symptoms arise.  Return precautions discussed.    Suicide Prevention Safety Plan:  Call 9-1-1 or go to your nearest emergency room if you think you might do something to hurt yourself.  Call the suicide hotline 9-8-8 when you want someone to talk to (open 24/7).          Relevant Orders    Comprehensive Metabolic Panel    Thyroid Stimulating Hormone    Vitamin  B12    Complete Blood Count with Differential        Malignant neoplasm of lung (CMS code)    Current Assessment & Plan   Reviewed  oncologist note   She has an appointment tomorrow                    Physical Exam   General: Patient appearing, alert.  Psych -teary   I performed this evaluation using real-time telehealth tools, including a live video Zoom connection between my location and the patient's location. Prior to initiating, the patient consented to perform this evaluation using telehealth tools.          I, Tajunnisa T am acting as a scribe for services provided by Evy Candida Ke, MD on 12/10/23  4:54 PM.  The above scribed documentation accurately reflects the services I have provided.    Evy Candida Ke, MD   12/10/2023 4:55 PM

## 2023-12-11 ENCOUNTER — Telehealth
Admit: 2023-12-11 | Discharge: 2023-12-11 | Payer: MEDICARE | Attending: Student in an Organized Health Care Education/Training Program | Primary: Physician

## 2023-12-11 DIAGNOSIS — C3490 Malignant neoplasm of unspecified part of unspecified bronchus or lung: Secondary | ICD-10-CM

## 2023-12-11 NOTE — Patient Instructions (Addendum)
-   Thank you for visiting us  in the [facility] today. It was a pleasure to see you.   - please reach out in the future if you develop new symptoms or change your mind about surveillance CT scans  - If you have questions in regards to scheduling or administrative inquiries, please contact Rosina, my practice coordinator, by [employer] or at [phone].  - For clinical advice, please call the Main Clinic Number at [phone]. Select peferred language, then option 4 for the Registered Nurses Line.  - If any issues arise, especially fever or significant change in breathing, please call our clinic at [phone].

## 2023-12-11 NOTE — Progress Notes (Signed)
 Subjective:     IDENTIFICATION: 68 y.o. 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     HISTORY OF PRESENT ILLNESS:  -02/2020: Had a CT scan after she developed shortness of breath following a knee surgery   -02/15/2021 CT chest: Multiple cystic, ground glass, and part solid pulmonary nodules, stable from 03/21/2020. Several are concerning for minimally invasive or invasive adenocarcinoma. Most suspicious lesions include a right apical lesion with a 10 mm solid component and a posterior right upper lobe lesion with a 7 mm solid component.   -06/28/2021 CT chest wo contrast: Compared to 03/11/2020, multiple subsolid and groundglass cystic nodules are mildly increasing in size and/or solid component with dominant lesions in the right upper lobe, concerning for minimally invasive or invasive adenocarcinoma.   -11/05/2021 MRI brain: Chronic small vessel ischemic changes and age-related cerebral volume loss. No acute infarct or intracranial hemorrhage. Mild degenerative disc disease at C3-C4.   -11/07/2021 CT chest wo contrast: As compared to 06/28/2021, overall similar appearance of multiple bilateral adenocarcinoma spectrum lesions, although which are minimally enlarged compared with earlier CTs such as 03/15/2020, with few foci of more solid components likely representing minimally invasive adenocarcinoma.   -02/12/2022 CT chest wo contrast:  Similar to slightly increased size of slowly growing part-solid, groundglass, and cystic pulmonary nodules compatible with adenocarcinoma spectrum lesions (minimally invasive or invasive adenocarcinoma). Few new nonspecific pulmonary nodules. Attention on follow-up.   -02/15/2022: RUL wedge resections x2 and MLNDx with Dr. Court at [facility]   Path: Adenocarcinoma, three separate tumor nodules in the same lobe (pT3) with 0/10  lymph nodes negative for malignancy. Negative surgical margins. Stage IIA (pT3N0)   STAMP: BRAF V600E, SETD2 mutations, TMB 5.3 mut/mb   PD-L1 TPS < 1%  -03/15/2022: Established care with Dr. Barnabas Spence. Interested in more natural and holistic approach and declined adjuvant chemotherapy. Planning CT scans every 6 months. Declined ctDNA monitoring.   -12/05/2022 CT chest wo contrast: Scarring in the right lung consistent with prior treatment. Multifocal areas of faint ground glass opacity in the left lung.     Interval History:  She has been doing [employer] breathing, which has been really helpful.  Did a treck in [place] in March for her 68th birthday, 4 days. Walked 12 hours a day at altitude.   She spent 2 months in [place]. Looking to possibly buy a home there at some point in the future, although she worries that her French isn't strong enough.  Does have a lot more social support in [place].  Ultimately wants to focus on "being a good person" and enjoying whatever time she has left rather than aggressive surveillance for lung cancer.   She does not want any surveillance scans -- has been consistent in this request. She feels confident that she would not want to treat lung cancer in the future, and so does not think getting CT scans to detect recurrence or new cancer makes sense.  Wants to leave follow up with us  open, but I encouraged her to reach out in the future as needed should she develop new symptoms or change her mind about screening.     Local Oncology: Gina Spence, [facility]     Social History     Social History Narrative    From [place] . Moved to [place] in 1992.  Lived in [place] for 4 years working with Optometrist with trauma    Daughter lives in [place] .     Shares house with 3 tenants; she is on the top floor.     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    Objective:     There were no vitals taken for this visit.    /10    ECOG Performance Status: 1 - Symptomatic but completely ambulatory  Constitutional: Well-appearing, no acute distress. Appropriately interactive.  Eyes: No scleral icterus. Conjunctiva clear.   ENMT: Moist mucus membranes. No oral lesions visible.   Lymph: Deferred  Respiratory: Respirations unlabored. Speaking in full sentences  Cardiovascular: Deferred  GI: Deferred  MSK: No peripheral edema.   Skin: No visible rashes  Neuro: Face symmetric. Normal speech.   Psych: Normal mood and affect.        LABORATORY RESULTS   I personally reviewed and interpreted each of the patient's relevant lab tests as outlined in my Assessment/Plan.    Most recent relevant labs   Component Value Date    WBC Count 8.3 02/15/2020    Hemoglobin 15.3 02/15/2020    Platelet Count 271 02/15/2020    Sodium, Serum / Plasma 138 11/21/2022    Potassium, Serum / Plasma 4.4 11/21/2022    Chloride, Serum / Plasma 99 11/21/2022    Carbon Dioxide, Total 25 11/21/2022    Urea Nitrogen, serum/plasma 15 11/21/2022    Creatinine 0.67 11/21/2022    eGFR - low estimate 96 02/15/2020    Glucose, non-fasting 92 11/21/2022    Calcium, total, Serum / Plasma 9.8 11/21/2022    Bilirubin, Total 0.4 02/15/2020    AST 20 02/15/2020    Alanine transaminase 21 02/15/2020    Alkaline Phosphatase 66 02/15/2020    Albumin, Serum / Plasma 4.3 02/15/2020       RADIOGRAPHIC RESULTS   I personally reviewed and interpreted each of the patient's relevant imaging studies in conjunction with the corresponding formal radiology reports, as outlined in my Assessment/Plan. Imaging result in HPI above.     Assessment:     Impression: 68 y.o. female with resected stage IIB lung adenocarcinoma (pT3N0cM0 with 3 separate nodules in the same lobe; BRAF V600E mutation, PD-L1 <1%), s/p RUL  lobectomy), here for a second opinion regarding management.     We discussed the pathological diagnosis of lung adenocarcinoma based on review of her RUL lobectomy pathology report. Stamp NGS testing revealed a BRAF V600E mutation.       We then discussed staging. Based on 3 separate tumors in the RUL but no involvement of 10 sampled LN at the time of her lobectomy, she has a stage IIB (pT3N0cM0) tumor. We discussed the fact that stage IIB disease is considered curable, but with a possible chance of recurrence.      We then discussed treatment options. We noted that the standard of care in stage IIB disease is localized in nature with surgical resection. She completed RUL lobectomy at [facility] in 02/15/2022. Adjuvant chemotherapy was recommended after surgery to reduce her recurrence risk, but  in the end she declined. Now transitioned to surveillance. She has bee reluctant to get routine imaging due to some doubt about whether she would ever accept additional treatment for her lung cancer. CT chest from 12/05/2022 showed NED; need to CTM a few GGOs in left lung that may be additional adenocarcinoma spectrum lesions.     Today, Gina Spence presents for follow-up.  We had previously discussed the recommended screening paradigm for resected NSCLC and the importance of CT scans for detecting early recurrence.  However she has given a lot of thought to whether or not she wants to do surveillance imaging.  Similar to our prior discussions, at this point she feels quite strongly that surveillance imaging is not within her treatment goals.  She does not think she would ever want more treatment for lung cancer should we detect a recurrence or a new primary tumor on CT scans, and therefore she does not think the information will be helpful or useful. She prefers to focus on the present and enjoying whatever time she has left. At this point, she wants to leave our follow up open for now. I am always happy to see her back should she  develop new symptoms. Should she change her mind in the future about imaging, I be happy to order surveillance imaging as she would like.    Finally, we discussed supportive care. In terms of symptoms, she is largely asymptomatic at this point.  Focusing on holistic/integrative approaches to cancer survivorship including cannabis oil, juicing etc.    Plan:     #Lung adenocarcinoma  Stage IIB (pT3N0cM0) s/p RUL lobectomy at [facility]. BRAF V600E mutation, PD-L1 <1% in 02/15/2022.  - Declined adjuvant chemotherapy based on discussions with [facility] oncologist, Dr. Gaither  - Now in surveillance    -Per NCCN guidelines, surveillance for stage I-II NSCLC includes CT chest +/- contrast q6 mo for 2-3 yr, then low dose CT chest noncon annually  - I ordered CT chest to be done in April 2025, but in the end she has decided against doing any surveillance imaging given that she feels quite clear that she would not want additional treatment for a cancer recurrence or new diagnosis  - We have discussed the nature of her BRAF V600E mutation and the availability of targeted therapy should she ever have recurrence  -Today, she asks that we leave her follow up with me open for now should she develop new symptoms in the future     RTC as needed in the future should she develop new symptoms or change her mind about surveillance imaging     The above plan was reviewed with the patient and all questions and issues were addressed to the patients satisfaction.    Below for billing only  I spent a total of 30 minutes on this patient's care on the day of their visit excluding time spent related to any billed procedures. This time includes face-to-face time with the patient as well as time spent documenting in the medical record, reviewing patient's records and tests, obtaining history, placing orders, communicating with other healthcare professionals, counseling the patient, family, or caregiver, and/or care coordination for the diagnoses  above.    I performed this evaluation using real-time telehealth tools, including a live video Zoom connection between my location and the patient's location. Prior to initiating, I obtained informed verbal consent to perform this evaluation using telehealth tools and answered all the questions about the telehealth interaction. My location is not in  a [facility] clinical facility.

## 2024-01-14 LAB — COMPREHENSIVE METABOLIC PANEL
AST: 21 IU/L (ref 0–40)
Alanine transaminase: 15 IU/L (ref 0–32)
Albumin, Serum / Plasma: 4.8 g/dL (ref 3.9–4.9)
Alkaline Phosphatase: 62 IU/L (ref 49–135)
BUN/Creatinine Ratio (External Lab): 19 (ref 12–28)
Bilirubin, Total: 0.6 mg/dL (ref 0.0–1.2)
Calcium, total, Serum / Plasma: 9.9 mg/dL (ref 8.7–10.3)
Carbon Dioxide, Total: 25 mmol/L (ref 20–29)
Chloride, Serum / Plasma: 97 mmol/L (ref 96–106)
Creatinine: 0.7 mg/dL (ref 0.57–1.00)
Globulin, Total: 2.6 g/dL (ref 1.5–4.5)
Glucose, non-fasting: 104 mg/dL — ABNORMAL HIGH (ref 70–99)
Potassium, Serum / Plasma: 4.6 mmol/L (ref 3.5–5.2)
Protein, Total, Serum / Plasma: 7.4 g/dL (ref 6.0–8.5)
Sodium, Serum / Plasma: 137 mmol/L (ref 134–144)
Urea Nitrogen, serum/plasma: 13 mg/dL (ref 8–27)
eGFRcr: 93 mL/min/1.73 (ref >59)

## 2024-01-14 LAB — COMPLETE BLOOD COUNT WITH DIFFERENTIAL
% Basophils: 1 %
% Eosinophils: 2 %
% Immature Granulocytes: 0 %
% Lymphocytes: 34 %
% Monocytes: 12 %
% Neutrophils: 51 %
Abs Basophils: 0.1 x10E3/uL (ref 0.0–0.2)
Abs Eosinophils: 0.1 x10E3/uL (ref 0.0–0.4)
Abs Imm Granulocytes: 0 x10E3/uL (ref 0.0–0.1)
Abs Lymphocytes: 1.6 x10E3/uL (ref 0.7–3.1)
Abs Monocytes: 0.5 x10E3/uL (ref 0.1–0.9)
Abs Neutrophils: 2.4 x10E3/uL (ref 1.4–7.0)
Hematocrit: 47.5 % — ABNORMAL HIGH (ref 34.0–46.6)
Hemoglobin: 15.6 g/dL (ref 11.1–15.9)
MCH: 30.5 pg (ref 26.6–33.0)
MCHC: 32.8 g/dL (ref 31.5–35.7)
MCV: 93 fL (ref 79–97)
Platelet Count: 324 x10E3/uL (ref 150–450)
RBC Count: 5.12 x10E6/uL (ref 3.77–5.28)
RDW-CV: 12.8 % (ref 11.7–15.4)
WBC Count: 4.7 x10E3/uL (ref 3.4–10.8)

## 2024-01-14 LAB — VITAMIN B12: Vitamin B12: 732 pg/mL (ref 232–1245)

## 2024-01-14 LAB — THYROID STIMULATING HORMONE: Thyroid Stimulating Hormone: 0.775 u[IU]/mL (ref 0.450–4.500)

## 2024-01-21 ENCOUNTER — Ambulatory Visit: Admit: 2024-01-21 | Payer: MEDICARE | Attending: Physician | Primary: Physician

## 2024-01-21 DIAGNOSIS — F32A Depression, unspecified: Secondary | ICD-10-CM

## 2024-01-21 NOTE — Assessment & Plan Note (Addendum)
 HPI    Reports low mood  her whole life.    Overall feeling better today    She Would prefer not to take daily meds     Notes improvement as she is choosing better people in her life.    Has been traveling all over the world.    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    Currently seeing a therapist in [place] with benefit.     Reports never sleeping well her whole life.    Has family history of poor sleep.    Uses lorazepam  0.5 mg occasionally for panic attacks or severe anxiety    Breaks pills in half to avoid dependence, does not take daily.    Reports being in fight or flight mode frequently.    [place], living [place] surrounded by trees and animals.    ASSESSMENT AND PLAN    Declined psychiatry referral, prefers current therapist.    In terms of medication  , prefers natural approaches.    Advised to continue current therapy.    Encouraged to reach out if needs change.    Please let me know if symptoms persist, worsen, or if any new symptoms arise.    Return precautions discussed.      Suicide Prevention Safety Plan:  Call 9-1-1 or go to your nearest emergency room if you think you might do something to hurt yourself.  Call the suicide hotline 9-8-8 when you want someone to talk to (open 24/7).

## 2024-01-21 NOTE — Assessment & Plan Note (Addendum)
 HPI    Reviewed oncology notes.    ASSESSMENT AND PLAN  Continue to follow up with  oncologist        Please let me know if   any new symptoms arise.

## 2024-01-21 NOTE — Patient Instructions (Signed)
 It was very nice seeing you in clinic today.Please schedule a preventative /annual exam yearly     I have shared my note with you.     Problem List Items Addressed This Visit       Depression - Primary      Advised to continue current therapy.    Encouraged to reach out if needs change.    Please let me know if symptoms persist, worsen, or if any new symptoms arise.    Return precautions discussed.      Suicide Prevention Safety Plan:  Call 9-1-1 or go to your nearest emergency room if you think you might do something to hurt yourself.  Call the suicide hotline 9-8-8 when you want someone to talk to (open 24/7).           Malignant neoplasm of lung (CMS code)     Continue to follow up with  oncologist        Please let me know if   any new symptoms arise.            Elevated glucose       Ordered A1C to assess 74-month blood sugar control.    Return precautions discussed.         Relevant Orders    Hemoglobin A1c      You can always email me on my chart or call my office if you have any questions after your visit    This note was created using Dragon voice recognition software. Some errors in proofreading can occur. Please contact me if there are errors or changes need to be modified or if there are any questions.

## 2024-01-21 NOTE — Progress Notes (Signed)
 Gina Spence is a 68 y.o. female patient here with a chief complaint of Follow-up (Test results )    Problem based HPI and assessment and plan    Visit Diagnoses       Depression - Primary    Current Assessment & Plan   HPI    Reports low mood  her whole life.    Overall feeling better today    She Would prefer not to take daily meds     Notes improvement as she is choosing better people in her life.    Has been traveling all over the world.    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    Currently [place] benefit.     Reports never sleeping well her whole life.    Has family history of poor sleep.    Uses lorazepam  0.5 mg occasionally for panic attacks or severe anxiety    Breaks pills in half to avoid dependence, does not take daily.    Reports being in fight or flight mode frequently.    [place] the country, living [place] cabin surrounded by trees and animals.    ASSESSMENT AND PLAN    Declined psychiatry referral, prefers current therapist.    In terms of medication  , prefers natural approaches.    Advised to continue current therapy.    Encouraged to reach out if needs change.    Please let me know if symptoms persist, worsen, or if any new symptoms arise.    Return precautions discussed.      Suicide Prevention Safety Plan:  Call 9-1-1 or go to your nearest emergency room if you think you might do something to hurt yourself.  Call the suicide hotline 9-8-8 when you want someone to talk to (open 24/7).               Elevated glucose    Current Assessment & Plan   Hemoglobin A1c (PERCENT)   Date Value   02/15/2020 5.8 (H)     HPI  Blood glucose slightly borderline high.    No history of diabetes or prediabetes.    Reviewed lab work.    ASSESSMENT AND PLAN    Ordered A1C to assess 34-month blood sugar control.    Return precautions discussed.         Relevant Orders    Hemoglobin A1c        Malignant neoplasm of lung (CMS code)    Current Assessment & Plan    HPI    Reviewed oncology notes.    ASSESSMENT AND PLAN  Continue to follow up with  oncologist        Please let me know if   any new symptoms arise.                      Physical Exam   Cardiovascular: Normal rate, regular rhythm and normal heart sounds. No murmur heard.  Pulmonary/Chest: Effort normal  No respiratory distress. She has no wheezes.   General: Alert. Appears stated age. No acute distress.     Psych: normal affect     I, Tajunnisa T am acting as a scribe for services provided by Evy Candida Ke, MD on 01/21/24  3:24 PM.  The above scribed documentation accurately reflects the services I have provided.    Evy Candida Ke, MD   01/21/2024 3:24 PM

## 2024-01-21 NOTE — Assessment & Plan Note (Signed)
 Hemoglobin A1c (PERCENT)   Date Value   02/15/2020 5.8 (H)     HPI  Blood glucose slightly borderline high.    No history of diabetes or prediabetes.    Reviewed lab work.    ASSESSMENT AND PLAN    Ordered A1C to assess 17-month blood sugar control.    Return precautions discussed.

## 2024-01-22 LAB — HEMOGLOBIN A1C: Hemoglobin A1c: 5.6 % (ref 4.3–5.6)
